# Patient Record
Sex: Female | Born: 1985 | Race: White | Hispanic: No | Marital: Married | Smoking: Never smoker
Health system: Southern US, Community
[De-identification: ages and names within clinical notes are randomized; demographics above are authoritative.]

## PROBLEM LIST (undated history)

## (undated) DIAGNOSIS — O139 Gestational [pregnancy-induced] hypertension without significant proteinuria, unspecified trimester: Secondary | ICD-10-CM

## (undated) DIAGNOSIS — R519 Headache, unspecified: Secondary | ICD-10-CM

## (undated) DIAGNOSIS — O26899 Other specified pregnancy related conditions, unspecified trimester: Secondary | ICD-10-CM

## (undated) DIAGNOSIS — F410 Panic disorder [episodic paroxysmal anxiety] without agoraphobia: Secondary | ICD-10-CM

## (undated) DIAGNOSIS — O09299 Supervision of pregnancy with other poor reproductive or obstetric history, unspecified trimester: Secondary | ICD-10-CM

## (undated) DIAGNOSIS — S42302A Unspecified fracture of shaft of humerus, left arm, initial encounter for closed fracture: Secondary | ICD-10-CM

## (undated) DIAGNOSIS — G5600 Carpal tunnel syndrome, unspecified upper limb: Secondary | ICD-10-CM

## (undated) DIAGNOSIS — R51 Headache: Secondary | ICD-10-CM

## (undated) DIAGNOSIS — S82899A Other fracture of unspecified lower leg, initial encounter for closed fracture: Secondary | ICD-10-CM

## (undated) HISTORY — PX: TONSILLECTOMY: SUR1361

## (undated) HISTORY — PX: CHOLECYSTECTOMY: SHX55

## (undated) HISTORY — PX: APPENDECTOMY: SHX54

---

## 2004-10-06 HISTORY — PX: OTHER SURGICAL HISTORY: SHX169

## 2011-07-03 ENCOUNTER — Encounter: Payer: Self-pay | Admitting: *Deleted

## 2011-07-03 ENCOUNTER — Emergency Department (HOSPITAL_COMMUNITY)
Admission: EM | Admit: 2011-07-03 | Discharge: 2011-07-03 | Disposition: A | Discharge status: Home / Self Care | Payer: BC Managed Care – PPO | Attending: Emergency Medicine | Admitting: Emergency Medicine

## 2011-07-03 ENCOUNTER — Other Ambulatory Visit: Payer: Self-pay

## 2011-07-03 DIAGNOSIS — R079 Chest pain, unspecified: Secondary | ICD-10-CM | POA: Insufficient documentation

## 2011-07-03 DIAGNOSIS — R209 Unspecified disturbances of skin sensation: Secondary | ICD-10-CM | POA: Insufficient documentation

## 2011-07-03 DIAGNOSIS — Z79899 Other long term (current) drug therapy: Secondary | ICD-10-CM | POA: Insufficient documentation

## 2011-07-03 DIAGNOSIS — F411 Generalized anxiety disorder: Secondary | ICD-10-CM | POA: Insufficient documentation

## 2011-07-03 DIAGNOSIS — F419 Anxiety disorder, unspecified: Secondary | ICD-10-CM

## 2011-07-03 DIAGNOSIS — E669 Obesity, unspecified: Secondary | ICD-10-CM | POA: Insufficient documentation

## 2011-07-03 HISTORY — DX: Panic disorder (episodic paroxysmal anxiety): F41.0

## 2011-07-03 MED ORDER — LORAZEPAM 0.5 MG PO TABS: 0.5000 mg | ORAL_TABLET | Freq: Three times a day (TID) | ORAL | Status: AC | PRN

## 2011-07-03 MED ORDER — LORAZEPAM 1 MG PO TABS
1.0000 mg | ORAL_TABLET | Freq: Once | ORAL | Status: AC
  Administered 2011-07-03: 1 mg via ORAL
  Filled 2011-07-03: qty 1

## 2011-07-03 NOTE — ED Provider Notes (Signed)
History   Chart scribed for [name]. Sandra Samarin, MD by Enos Fling; the patient was seen in room APA10/APA10; this patient's care was started at 7:45 PM.    CSN: [id number] Arrival date & time: 07/03/2011  6:41 PM  Chief Complaint  Patient presents with  . Chest Pain  . Numbness    bilateral arm pain   HPI Francenia Newton is a 25 Newtono. female who presents to the Emergency Department complaining of palpitations. Pt states she has been sleep walking for the past 6-7 months. This is new for her and onset while under a lot of stress d/t family custody issues. Pt states another episode occurred last night where she sat up in bed, when her husband woke her up she felt her "heart was jumping out of her chest" and c/o tingling to arms bilaterally. Pt was still c/o palpitations and BUE tingling this evening and currently states she feels she cannot calm down. No other complaints. Denies any chest pain now or previously. Denies sob, n/v, diaphoresis, abd pain, back pain, headache, cough, or congestion. No recent sick contacts or travel. Pt admits to h/o panic attacks, but has been off all meds for the past 4 years. Pt reports fh/o MI in grandfather (age 20) and uncle (age 64).   Past Medical History  Diagnosis Date  . Panic attacks history of    Past Surgical History  Procedure Date  . Appendectomy   . Cholecystectomy   . Tonsillectomy     History reviewed. No pertinent family history.  History  Substance Use Topics  . Smoking status: Never Smoker   . Smokeless tobacco: Not on file  . Alcohol Use: No    OB History    Grav Para Term Preterm Abortions TAB SAB Ect Mult Living                  Review of Systems 10 Systems reviewed and are negative for acute change except as noted in the HPI.  Allergies  Review of patient's allergies indicates no known allergies.  Home Medications   Current Outpatient Rx  Name Route Sig Dispense Refill  . NORGESTIM-ETH ESTRAD TRIPHASIC  0.18/0.215/0.25 MG-25 MCG PO TABS Oral Take 1 tablet by mouth daily.      Marland Kitchen NORGESTIM-ETH ESTRAD TRIPHASIC 0.18/0.215/0.25 MG-35 MCG PO TABS Oral Take 1 tablet by mouth every evening.        BP 127/85  Pulse 89  Temp(Src) 99.2 F (37.3 C) (Oral)  Resp 20  Ht 5\' 6"  (1.676 m)  Wt 225 lb (102.059 kg)  BMI 36.32 kg/m2  SpO2 98%  LMP 07/01/2011  Physical Exam  Nursing note and vitals reviewed. Constitutional: She is oriented to person, place, and time. She appears well-developed and well-nourished. No distress.  HENT:  Head: Normocephalic and atraumatic.  Right Ear: External ear normal.  Left Ear: External ear normal.  Nose: Nose normal.  Mouth/Throat: Oropharynx is clear and moist.  Eyes:       Normal appearance.  Neck: Neck supple.  Cardiovascular: Normal rate, regular rhythm, normal heart sounds and intact distal pulses.   Pulmonary/Chest: Effort normal and breath sounds normal.  Abdominal: Soft. There is no tenderness.  Musculoskeletal: Normal range of motion.       Normal pulses  Neurological: She is alert and oriented to person, place, and time. No sensory deficit.       Motor intact in all extremities  Skin: Skin is warm and dry.  Color normal  Psychiatric: She has a normal mood and affect.    ED Course  Procedures - none  OTHER DATA REVIEWED: Nursing notes and vital signs reviewed.   MEDS GIVEN IN ED: LORazepam (ATIVAN) tablet 1 mg (1 mg Oral Given 07/03/11 2045)     MDM   Pt with symptoms and history consistent with panic attack and some residual anxiety symptoms.  ECG shows NSR at rate 79, normal axis and intervals.  Poor r wave progression, probably due to her chest body habitus, some obesity.  I doubt ACS.  Will treat with oral ativan here.  Pt used tobe on meds for anxiety/panic in the past but had stopped 4 years ago.  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  Pt is counseled to start seeing a PCP to discuss treatment of stress and anxiety.    IMPRESSION: No diagnosis found.  DISCHARGE MEDICATIONS: New Prescriptions   No medications on file    SCRIBE ATTESTATION: I personally performed the services described in this documentation, which was scribed in my presence. The recorded information has been reviewed and considered. Sandra Orea Y.  9:40 PM Pt's symptoms are much improved, relaxed, heart is not poudning, no SOB.    [name]. Oletta Lamas, MD 07/03/11 2141

## 2011-07-03 NOTE — ED Notes (Signed)
Pt 's spouse states pt had an episode during the night where she was sitting up in the bed and was having a nightmare and when pt woke up she was c/o chest pain and tingling to bilateral arms; pt states she has continued to have chest pain and tingling to bilateral arms; pt states she feels hot and can not catch a good breath

## 2011-07-03 NOTE — Discharge Instructions (Signed)
 Anxiety and Panic Attacks Your caregiver has informed you that you are having an anxiety or panic attack. There may be many forms of this. Most of the time these attacks come suddenly and without warning. They come at any time of day, including periods of sleep, and at any time of life. They may be strong and unexplained. Although panic attacks are very scary, they are physically harmless. Sometimes the cause of your anxiety is not known. Anxiety is a protective mechanism of the body in its fight or flight mechanism. Most of these perceived danger situations are actually nonphysical situations (such as anxiety over losing a job). CAUSES The causes of an anxiety or panic attack are many. Panic attacks may occur in otherwise healthy people given a certain set of circumstances. There may be a genetic cause for panic attacks. Some medications may also have anxiety as a side effect. SYMPTOMS Some of the most common feelings are:  Intense terror.  Dizziness, feeling faint.   Hot and cold flashes.   Fear of going crazy.   Feelings that nothing is real.   Sweating.   Shaking.   Chest pain or a fast heartbeat (palpitations).  Smothering, choking sensations.   Feelings of impending doom and that death is near.   Tingling of extremities, this may be from over breathing.   Altered reality (derealization).   Being detached from yourself (depersonalization).   Several symptoms can be present to make up anxiety or panic attacks. DIAGNOSIS The evaluation by your caregiver will depend on the type of symptoms you are experiencing. The diagnosis of anxiety or pain attack is made when no physical illness can be determined to be a cause of the symptoms. TREATMENT Treatment to prevent anxiety and panic attacks may include:  Avoidance of circumstances that cause anxiety.   Reassurance and relaxation.   Regular exercise.   Relaxation therapies, such as yoga.   Psychotherapy with a psychiatrist  or therapist.   Avoidance of caffeine, alcohol and illegal drugs.   Prescribed medication.  SEEK IMMEDIATE MEDICAL CARE IF:  You experience panic attack symptoms that are different than your usual symptoms.   You have any worsening or concerning symptoms.  Document Released: 09/22/2005 Document Re-Released: 03/12/2010 [facility] Patient Information 2011 [place], [place].    Ativan  can cause drowsiness or slowed breathing so use with caution, no driving or operating machinery.   Do not combine with other sedatives, or alcohol

## 2011-12-11 ENCOUNTER — Encounter (HOSPITAL_COMMUNITY): Payer: Self-pay | Admitting: *Deleted

## 2011-12-11 ENCOUNTER — Emergency Department (INDEPENDENT_AMBULATORY_CARE_PROVIDER_SITE_OTHER)
Admission: EM | Admit: 2011-12-11 | Discharge: 2011-12-11 | Disposition: A | Discharge status: Home / Self Care | Payer: BC Managed Care – PPO | Source: Home / Self Care

## 2011-12-11 ENCOUNTER — Emergency Department (INDEPENDENT_AMBULATORY_CARE_PROVIDER_SITE_OTHER): Payer: BC Managed Care – PPO

## 2011-12-11 DIAGNOSIS — S161XXA Strain of muscle, fascia and tendon at neck level, initial encounter: Secondary | ICD-10-CM

## 2011-12-11 DIAGNOSIS — S139XXA Sprain of joints and ligaments of unspecified parts of neck, initial encounter: Secondary | ICD-10-CM

## 2011-12-11 MED ORDER — IBUPROFEN 800 MG PO TABS: 800.0000 mg | ORAL_TABLET | Freq: Three times a day (TID) | ORAL | Status: AC

## 2011-12-11 MED ORDER — TRAMADOL HCL 50 MG PO TABS: 50.0000 mg | ORAL_TABLET | Freq: Four times a day (QID) | ORAL | Status: AC | PRN

## 2011-12-11 MED ORDER — METHOCARBAMOL 750 MG PO TABS: 750.0000 mg | ORAL_TABLET | Freq: Four times a day (QID) | ORAL | Status: AC
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
## 2011-12-11 NOTE — Discharge Instructions (Signed)
Your cervical spine x-ray was normal. You may have more muscle soreness and stiffness tomorrow. Your symptoms should gradually improve over the next few days. If her symptoms are not improving in one week call Dr. Luiz Blare office for a followup appointment

## 2011-12-12 NOTE — ED Provider Notes (Signed)
Medical screening examination/treatment/procedure(s) were performed by non-physician practitioner and as supervising physician I was immediately available for consultation/collaboration.  Luiz Blare MD   Domenick Gong, MD 12/12/11 [phone]

## 2012-02-08 ENCOUNTER — Ambulatory Visit (INDEPENDENT_AMBULATORY_CARE_PROVIDER_SITE_OTHER): Payer: BC Managed Care – PPO | Admitting: Internal Medicine

## 2012-02-08 ENCOUNTER — Encounter: Payer: Self-pay | Admitting: Internal Medicine

## 2012-02-08 VITALS — BP 134/83 | HR 80 | Temp 98.7°F | Resp 20 | Ht 65.5 in | Wt 233.0 lb

## 2012-02-08 DIAGNOSIS — L049 Acute lymphadenitis, unspecified: Secondary | ICD-10-CM

## 2012-02-08 NOTE — Progress Notes (Signed)
  Subjective:    Patient ID: [name], female    DOB: [birth year], 26 y.o.   MRN: [id number]  HPIComplaining of a sore knot on the right posterior scalp for 3 days She has had bad allergy symptoms for the last 2 weeks but these seem to be responding to Zyrtec Just prior to the onset of the knot she had a pustule in the scalp which was tender for 3 days but resolved    Review of Systems     Objective:   Physical Exam Vital signs stable except overweight Right posterior cervical lymph node 1 cm tender without cellulitis or redness There no other posterior cervical nodes No scalp lesions are found Tympanic membranes and canals are normal Nares are boggy with clear mucus       Assessment & Plan:  Problem #1 lymphadenitis secondary to prior scalp lesion  Doxycycline 100 twice a day #10 Recheck if not well in one week

## 2012-09-28 ENCOUNTER — Encounter (HOSPITAL_COMMUNITY): Payer: Self-pay

## 2012-09-28 ENCOUNTER — Emergency Department (HOSPITAL_COMMUNITY)
Admission: EM | Admit: 2012-09-28 | Discharge: 2012-09-28 | Disposition: A | Discharge status: Home / Self Care | Payer: BC Managed Care – PPO | Attending: Emergency Medicine | Admitting: Emergency Medicine

## 2012-09-28 ENCOUNTER — Other Ambulatory Visit: Payer: Self-pay

## 2012-09-28 DIAGNOSIS — K219 Gastro-esophageal reflux disease without esophagitis: Secondary | ICD-10-CM | POA: Insufficient documentation

## 2012-09-28 DIAGNOSIS — R1013 Epigastric pain: Secondary | ICD-10-CM | POA: Insufficient documentation

## 2012-09-28 DIAGNOSIS — Z9889 Other specified postprocedural states: Secondary | ICD-10-CM | POA: Insufficient documentation

## 2012-09-28 DIAGNOSIS — Z79899 Other long term (current) drug therapy: Secondary | ICD-10-CM | POA: Insufficient documentation

## 2012-09-28 DIAGNOSIS — Z9089 Acquired absence of other organs: Secondary | ICD-10-CM | POA: Insufficient documentation

## 2012-09-28 DIAGNOSIS — Z8659 Personal history of other mental and behavioral disorders: Secondary | ICD-10-CM | POA: Insufficient documentation

## 2012-09-28 MED ORDER — PANTOPRAZOLE SODIUM 40 MG PO TBEC
40.0000 mg | DELAYED_RELEASE_TABLET | Freq: Once | ORAL | Status: AC
  Administered 2012-09-28: 40 mg via ORAL

## 2012-09-28 MED ORDER — ESOMEPRAZOLE MAGNESIUM 40 MG PO CPDR: 40.0000 mg | DELAYED_RELEASE_CAPSULE | Freq: Every day | ORAL | Status: DC

## 2012-09-28 MED ORDER — GI COCKTAIL ~~LOC~~
30.0000 mL | Freq: Once | ORAL | Status: AC
  Administered 2012-09-28: 30 mL via ORAL
  Filled 2012-09-28: qty 30

## 2012-09-28 NOTE — ED Notes (Signed)
Discharge instructions reviewed with pt, questions answered. Pt verbalized understanding.  

## 2012-09-28 NOTE — ED Notes (Signed)
Pt c/o burning pain in center of chest onset last night approx 11 pm, states feels like heartburn and took zantac for same with minimal relief.

## 2012-09-28 NOTE — ED Provider Notes (Signed)
History     CSN: [id number]  Arrival date & time 09/28/12  7829   First MD Initiated Contact with Patient 09/28/12 [phone]      Chief Complaint  Patient presents with  . Chest Pain    (Consider location/radiation/quality/duration/timing/severity/associated sxs/prior treatment) HPI This is a 26 year old female with a history of gastroesophageal reflux disease. She developed mild to moderate substernal burning yesterday evening about 10:30 PM. She took Zantac and went to bed. She woke this morning with more severe burning in her chest. It is similar to episodes she has had the past. There was no associated shortness of breath or nausea but she did have a bloated feeling in her epigastrium. She was given a GI cocktail prior to my evaluation with significant relief of her symptoms. She has been treated with Nexium in the past but is not currently on a proton pump inhibitor.  Past Medical History  Diagnosis Date  . Panic attacks history of    Past Surgical History  Procedure Date  . Appendectomy   . Cholecystectomy   . Tonsillectomy     No family history on file.  History  Substance Use Topics  . Smoking status: Never Smoker   . Smokeless tobacco: Not on file  . Alcohol Use: No    OB History    Grav Para Term Preterm Abortions TAB SAB Ect Mult Living                  Review of Systems  All other systems reviewed and are negative.    Allergies  Review of patient's allergies indicates no known allergies.  Home Medications   Current Outpatient Rx  Name  Route  Sig  Dispense  Refill  . CETIRIZINE HCL 10 MG PO TABS   Oral   Take 10 mg by mouth daily.         Marland Kitchen PRENATAL MULTIVITAMIN CH   Oral   Take 1 tablet by mouth daily.         Marland Kitchen NORGESTIM-ETH ESTRAD TRIPHASIC 0.18/0.215/0.25 MG-35 MCG PO TABS   Oral   Take 1 tablet by mouth every evening.             BP 118/61  Pulse 93  Temp 98.5 F (36.9 C) (Oral)  Resp 18  Ht 5\' 6"  (1.676 m)  Wt 215 lb  (97.523 kg)  BMI 34.70 kg/m2  SpO2 100%  LMP 09/18/2012  Physical Exam General: Well-developed, well-nourished female in no acute distress; appearance consistent with age of record HENT: normocephalic, atraumatic Eyes: pupils equal round and reactive to light; extraocular muscles intact Neck: supple Heart: regular rate and rhythm; no murmurs, rubs or gallops Lungs: clear to auscultation bilaterally Abdomen: soft; nondistended; nontender; bowel sounds present Extremities: No deformity; full range of motion Neurologic: Awake, alert and oriented; motor function intact in all extremities and symmetric; no facial droop Skin: Warm and dry Psychiatric: Normal mood and affect    ED Course  Procedures (including critical care time)    MDM     Date: 09/28/2012 5:06 AM  Rate: 89  Rhythm: normal sinus rhythm  QRS Axis: normal  Intervals: normal  ST/T Wave abnormalities: normal  Conduction Disutrbances: none  Narrative Interpretation: unremarkable  Comparison with previous EKG: unchanged           Carlisle Beers Devondre Guzzetta, MD 09/28/12 0600

## 2012-10-06 DIAGNOSIS — O139 Gestational [pregnancy-induced] hypertension without significant proteinuria, unspecified trimester: Secondary | ICD-10-CM

## 2012-10-06 HISTORY — DX: Gestational (pregnancy-induced) hypertension without significant proteinuria, unspecified trimester: O13.9

## 2013-01-18 LAB — OB RESULTS CONSOLE RPR: RPR: NONREACTIVE

## 2013-01-18 LAB — OB RESULTS CONSOLE ABO/RH

## 2013-02-01 LAB — OB RESULTS CONSOLE GC/CHLAMYDIA
Chlamydia: NEGATIVE
Gonorrhea: NEGATIVE

## 2013-08-09 LAB — OB RESULTS CONSOLE GBS: GBS: NEGATIVE

## 2013-08-14 ENCOUNTER — Inpatient Hospital Stay (HOSPITAL_COMMUNITY)
Admission: AD | Admit: 2013-08-14 | Discharge: 2013-08-15 | Disposition: A | Discharge status: Home / Self Care | Payer: BC Managed Care – PPO | Source: Ambulatory Visit | Attending: Obstetrics and Gynecology | Admitting: Obstetrics and Gynecology

## 2013-08-14 ENCOUNTER — Encounter (HOSPITAL_COMMUNITY): Payer: Self-pay

## 2013-08-14 ENCOUNTER — Inpatient Hospital Stay (HOSPITAL_COMMUNITY): Payer: BC Managed Care – PPO

## 2013-08-14 DIAGNOSIS — O479 False labor, unspecified: Secondary | ICD-10-CM

## 2013-08-14 DIAGNOSIS — O368190 Decreased fetal movements, unspecified trimester, not applicable or unspecified: Secondary | ICD-10-CM | POA: Insufficient documentation

## 2013-08-14 DIAGNOSIS — R109 Unspecified abdominal pain: Secondary | ICD-10-CM

## 2013-08-14 DIAGNOSIS — O99891 Other specified diseases and conditions complicating pregnancy: Secondary | ICD-10-CM | POA: Insufficient documentation

## 2013-08-14 DIAGNOSIS — M549 Dorsalgia, unspecified: Secondary | ICD-10-CM | POA: Insufficient documentation

## 2013-08-14 DIAGNOSIS — R1031 Right lower quadrant pain: Secondary | ICD-10-CM | POA: Insufficient documentation

## 2013-08-14 LAB — URINALYSIS, ROUTINE W REFLEX MICROSCOPIC
Ketones, ur: NEGATIVE mg/dL
Leukocytes, UA: NEGATIVE
Nitrite: NEGATIVE
Protein, ur: NEGATIVE mg/dL

## 2013-08-14 NOTE — MAU Note (Signed)
Constant right sided abdominal pain, low back pain, & abdominal tightness since this morning. Denies vaginal bleeding. No fetal movement since this morning.

## 2013-08-15 LAB — COMPREHENSIVE METABOLIC PANEL
AST: 16 U/L (ref 0–37)
Albumin: 2.8 g/dL — ABNORMAL LOW (ref 3.5–5.2)
CO2: 22 mEq/L (ref 19–32)
Calcium: 9.3 mg/dL (ref 8.4–10.5)
Creatinine, Ser: 0.6 mg/dL (ref 0.50–1.10)
GFR calc non Af Amer: >90 mL/min (ref >90)

## 2013-08-15 LAB — CBC
Hemoglobin: 12.5 g/dL (ref 12.0–15.0)
MCH: 30.6 pg (ref 26.0–34.0)
RBC: 4.08 MIL/uL (ref 3.87–5.11)
WBC: 14 10*3/uL — ABNORMAL HIGH (ref 4.0–10.5)

## 2013-08-15 NOTE — MAU Provider Note (Signed)
History     CSN: [id number]  Arrival date and time: 08/14/13 2256 Provider notified of patient: 0110 Provider at bedside: 0135   First Provider Initiated Contact with Patient 08/15/13 0139      Chief Complaint  Patient presents with  . Abdominal Pain  . Back Pain  . Decreased Fetal Movement   HPI  [name] is a 27 y.o G1P0 female at 2.[redacted] wks gestation presenting with complaints of constant  RLQ pain all evening.  She reports maintaining hydration and resting.  She reports feeling FM, but decreased. She denies VB or LOF.    Past Medical History  Diagnosis Date  . Panic attacks history of    Past Surgical History  Procedure Laterality Date  . Appendectomy    . Cholecystectomy    . Tonsillectomy      History reviewed. No pertinent family history.  History  Substance Use Topics  . Smoking status: Never Smoker   . Smokeless tobacco: Not on file  . Alcohol Use: No    Allergies: No Known Allergies  Prescriptions prior to admission  Medication Sig Dispense Refill  . Prenatal Vit-Fe Fumarate-FA (PRENATAL MULTIVITAMIN) TABS Take 1 tablet by mouth daily.      . cetirizine (ZYRTEC) 10 MG tablet Take 10 mg by mouth daily.        Review of Systems  Constitutional: Negative.   HENT: Negative.   Eyes: Negative.   Respiratory: Negative.   Cardiovascular: Negative.   Gastrointestinal: Positive for abdominal pain.       RLQ pain since yesterday Decreased FM, last felt @ 0900 yesterday  Genitourinary: Negative.   Musculoskeletal: Negative.   Skin: Negative.   Neurological: Negative.   Endo/Heme/Allergies: Negative.   Psychiatric/Behavioral: Negative.    Results for orders placed during the hospital encounter of 08/14/13 (from the past 24 hour(s))  URINALYSIS, ROUTINE W REFLEX MICROSCOPIC     Status: Abnormal   Collection Time    08/14/13 11:00 PM      Result Value Range   Color, Urine YELLOW  YELLOW   APPearance CLEAR  CLEAR   Specific Gravity,  Urine <1.005 (*) 1.005 - 1.030   pH 5.5  5.0 - 8.0   Glucose, UA NEGATIVE  NEGATIVE mg/dL   Hgb urine dipstick NEGATIVE  NEGATIVE   Bilirubin Urine NEGATIVE  NEGATIVE   Ketones, ur NEGATIVE  NEGATIVE mg/dL   Protein, ur NEGATIVE  NEGATIVE mg/dL   Urobilinogen, UA 0.2  0.0 - 1.0 mg/dL   Nitrite NEGATIVE  NEGATIVE   Leukocytes, UA NEGATIVE  NEGATIVE  COMPREHENSIVE METABOLIC PANEL     Status: Abnormal   Collection Time    08/15/13  2:13 AM      Result Value Range   Sodium 133 (*) 135 - 145 mEq/L   Potassium 3.9  3.5 - 5.1 mEq/L   Chloride 100  96 - 112 mEq/L   CO2 22  19 - 32 mEq/L   Glucose, Bld 100 (*) 70 - 99 mg/dL   BUN 7  6 - 23 mg/dL   Creatinine, Ser 6.44  0.50 - 1.10 mg/dL   Calcium 9.3  8.4 - 03.4 mg/dL   Total Protein 6.4  6.0 - 8.3 g/dL   Albumin 2.8 (*) 3.5 - 5.2 g/dL   AST 16  0 - 37 U/L   ALT 18  0 - 35 U/L   Alkaline Phosphatase 139 (*) 39 - 117 U/L   Total Bilirubin 0.2 (*) 0.3 -  1.2 mg/dL   GFR calc non Af Amer >90  >90 mL/min   GFR calc Af Amer >90  >90 mL/min  CBC     Status: Abnormal   Collection Time    08/15/13  2:13 AM      Result Value Range   WBC 14.0 (*) 4.0 - 10.5 K/uL   RBC 4.08  3.87 - 5.11 MIL/uL   Hemoglobin 12.5  12.0 - 15.0 g/dL   HCT 16.1  09.6 - 04.5 %   MCV 88.2  78.0 - 100.0 fL   MCH 30.6  26.0 - 34.0 pg   MCHC 34.7  30.0 - 36.0 g/dL   RDW 40.9  81.1 - 91.4 %   Platelets 241  150 - 400 K/uL   BPP: 6/8 in 31 minutes, AFI: 19.33cm  Physical Exam   Pulse 105, temperature 98.7 F (37.1 C), temperature source Oral, resp. rate 18, height 5' 5.5" (1.664 m), weight 117.935 kg (260 lb), last menstrual period 09/18/2012, SpO2 97.00%.  Physical Exam  Constitutional: She appears well-developed and well-nourished.  HENT:  Head: Normocephalic and atraumatic.  Eyes: EOM are normal.  Neck: Normal range of motion.  Cardiovascular: Normal rate, regular rhythm, normal heart sounds and intact distal pulses.   Respiratory: Effort normal and  breath sounds normal.  GI: Soft.  Relaxes appropriately after a contraction  VE: closed/thick/high Exam by: Judeth Horn, RN  MAU Course  Procedures EFM CCUA BPP/AFI CBC CMP Assessment and Plan  G1P0 37.3wks IUP Abdominal Pain of unknown etiology vs. Braxton-Hicks Contractions  Discharge Home Labor Precautions Continue with hydration and rest Keep appointment scheduled for 08/18/2013  *Dr. Billy Coast consulted on plan - agrees with plan  Kenard Gower, MSN, CNM 08/15/2013, 1:47 AM

## 2013-08-26 ENCOUNTER — Inpatient Hospital Stay (HOSPITAL_COMMUNITY)
Admission: AD | Admit: 2013-08-26 | Discharge: 2013-08-30 | DRG: 765 | Disposition: A | Discharge status: Home / Self Care | Payer: BC Managed Care – PPO | Source: Ambulatory Visit | Attending: Obstetrics & Gynecology | Admitting: Obstetrics & Gynecology

## 2013-08-26 ENCOUNTER — Encounter (HOSPITAL_COMMUNITY): Payer: Self-pay | Admitting: *Deleted

## 2013-08-26 DIAGNOSIS — O9903 Anemia complicating the puerperium: Secondary | ICD-10-CM | POA: Diagnosis not present

## 2013-08-26 DIAGNOSIS — O139 Gestational [pregnancy-induced] hypertension without significant proteinuria, unspecified trimester: Principal | ICD-10-CM | POA: Diagnosis present

## 2013-08-26 DIAGNOSIS — O133 Gestational [pregnancy-induced] hypertension without significant proteinuria, third trimester: Secondary | ICD-10-CM

## 2013-08-26 DIAGNOSIS — Z98891 History of uterine scar from previous surgery: Secondary | ICD-10-CM

## 2013-08-26 DIAGNOSIS — D62 Acute posthemorrhagic anemia: Secondary | ICD-10-CM | POA: Diagnosis not present

## 2013-08-26 DIAGNOSIS — O3660X0 Maternal care for excessive fetal growth, unspecified trimester, not applicable or unspecified: Secondary | ICD-10-CM | POA: Diagnosis present

## 2013-08-26 DIAGNOSIS — O324XX0 Maternal care for high head at term, not applicable or unspecified: Secondary | ICD-10-CM | POA: Diagnosis present

## 2013-08-26 HISTORY — DX: Unspecified fracture of shaft of humerus, left arm, initial encounter for closed fracture: S42.302A

## 2013-08-26 HISTORY — DX: Other fracture of unspecified lower leg, initial encounter for closed fracture: S82.899A

## 2013-08-26 LAB — COMPREHENSIVE METABOLIC PANEL
Albumin: 2.8 g/dL — ABNORMAL LOW (ref 3.5–5.2)
Alkaline Phosphatase: 144 U/L — ABNORMAL HIGH (ref 39–117)
BUN: 8 mg/dL (ref 6–23)
CO2: 20 mEq/L (ref 19–32)
Chloride: 102 mEq/L (ref 96–112)
Creatinine, Ser: 0.54 mg/dL (ref 0.50–1.10)
GFR calc Af Amer: >90 mL/min (ref >90)
GFR calc non Af Amer: >90 mL/min (ref >90)
Glucose, Bld: 88 mg/dL (ref 70–99)
Total Bilirubin: 0.2 mg/dL — ABNORMAL LOW (ref 0.3–1.2)

## 2013-08-26 LAB — URIC ACID: Uric Acid, Serum: 5.4 mg/dL (ref 2.4–7.0)

## 2013-08-26 LAB — CBC
HCT: 35.8 % — ABNORMAL LOW (ref 36.0–46.0)
Hemoglobin: 12.7 g/dL (ref 12.0–15.0)
MCV: 86.9 fL (ref 78.0–100.0)
RDW: 12.6 % (ref 11.5–15.5)
WBC: 9.4 10*3/uL (ref 4.0–10.5)

## 2013-08-26 LAB — RPR: RPR Ser Ql: NONREACTIVE

## 2013-08-26 LAB — LACTATE DEHYDROGENASE: LDH: 174 U/L (ref 94–250)

## 2013-08-26 LAB — ABO/RH: ABO/RH(D): A POS

## 2013-08-26 MED ORDER — MISOPROSTOL 25 MCG QUARTER TABLET
25.0000 ug | ORAL_TABLET | ORAL | Status: DC | PRN
  Administered 2013-08-26 – 2013-08-27 (×4): 25 ug via VAGINAL
  Filled 2013-08-26 (×4): qty 0.25

## 2013-08-26 MED ORDER — ACETAMINOPHEN 500 MG PO TABS
1000.0000 mg | ORAL_TABLET | Freq: Once | ORAL | Status: AC
  Administered 2013-08-26: 1000 mg via ORAL
  Filled 2013-08-26: qty 2

## 2013-08-26 MED ORDER — OXYCODONE-ACETAMINOPHEN 5-325 MG PO TABS: 1.0000 | ORAL_TABLET | ORAL | Status: DC | PRN

## 2013-08-26 MED ORDER — OXYTOCIN BOLUS FROM INFUSION: 500.0000 mL | INTRAVENOUS | Status: DC

## 2013-08-26 MED ORDER — LACTATED RINGERS IV SOLN
INTRAVENOUS | Status: DC
  Administered 2013-08-28 (×2): via INTRAVENOUS

## 2013-08-26 MED ORDER — OXYTOCIN 40 UNITS IN LACTATED RINGERS INFUSION - SIMPLE MED: 62.5000 mL/h | INTRAVENOUS | Status: DC

## 2013-08-26 MED ORDER — LIDOCAINE HCL (PF) 1 % IJ SOLN
30.0000 mL | INTRAMUSCULAR | Status: DC | PRN
  Filled 2013-08-26: qty 30

## 2013-08-26 MED ORDER — ACETAMINOPHEN 325 MG PO TABS
650.0000 mg | ORAL_TABLET | ORAL | Status: DC | PRN
  Administered 2013-08-28: 650 mg via ORAL
  Filled 2013-08-26: qty 2

## 2013-08-26 MED ORDER — TERBUTALINE SULFATE 1 MG/ML IJ SOLN: 0.2500 mg | Freq: Once | INTRAMUSCULAR | Status: AC | PRN

## 2013-08-26 MED ORDER — IBUPROFEN 600 MG PO TABS: 600.0000 mg | ORAL_TABLET | Freq: Four times a day (QID) | ORAL | Status: DC | PRN

## 2013-08-26 MED ORDER — ONDANSETRON HCL 4 MG/2ML IJ SOLN: 4.0000 mg | Freq: Four times a day (QID) | INTRAMUSCULAR | Status: DC | PRN

## 2013-08-26 MED ORDER — FLEET ENEMA 7-19 GM/118ML RE ENEM: 1.0000 | ENEMA | RECTAL | Status: DC | PRN

## 2013-08-26 MED ORDER — CITRIC ACID-SODIUM CITRATE 334-500 MG/5ML PO SOLN
30.0000 mL | ORAL | Status: DC | PRN
  Administered 2013-08-28: 30 mL via ORAL
  Filled 2013-08-26: qty 15

## 2013-08-26 MED ORDER — LACTATED RINGERS IV SOLN: 500.0000 mL | INTRAVENOUS | Status: DC | PRN

## 2013-08-26 NOTE — H&P (Deleted)
[name] is a 27 y.o. female G1, presenting for labor IOL at 39 wks for Gestational HTN. She was scheduled for IOL obn 11/23 night but was moved to today due to HA/nausea/vomiting and visual spots.  [facility], Primary Dr Seymour Bars. Obese but no medical hx. Developed GHTN, but well controlled without medication. No GDM. Macrosomia suspected, last office sono at 37 wks 8'12" at 97% and AC at 99% and AFI 25 cm. ANtesting wkly for GHTN and polyhydramnios, reactive NST and BPP 8/8.   Maternal Medical History:  Reason for admission: Nausea.     OB History   Grav Para Term Preterm Abortions TAB SAB Ect Mult Living   1              Past Medical History  Diagnosis Date  . Panic attacks history of  . Arm fracture, left   . Ankle fracture     hx of rt   Past Surgical History  Procedure Laterality Date  . Appendectomy    . Cholecystectomy    . Tonsillectomy    . Tendonitis surgery  2006   Family History: family history includes Cancer in her maternal grandfather and maternal uncle. Social History:  reports that she has never smoked. She has never used smokeless tobacco. She reports that she does not drink alcohol or use illicit drugs.   Prenatal Transfer Tool  Maternal Diabetes: No Genetic Screening: Normal Ultrascreen and AFP1 normal Maternal Ultrasounds/Referrals: Normal Fetal Ultrasounds or other Referrals:  None Maternal Substance Abuse:  No Significant Maternal Medications:  None Significant Maternal Lab Results:  Lab values include: Group B Strep negative. Other Comments:  Polyhydramnios and suspected Macrosomia Gestational HTN, no meds, no preeclampsia  Review of Systems  Eyes: Positive for blurred vision.  Respiratory: Negative for shortness of breath.   Cardiovascular: Negative for chest pain.  Gastrointestinal: Positive for nausea and vomiting. Negative for abdominal pain.  Neurological: Positive for headaches.    Dilation: Fingertip Effacement (%):  Thick Station: Ballotable Exam by:: Currie Paris, RN Blood pressure 118/72, pulse 87, temperature 98.2 F (36.8 C), temperature source Oral, resp. rate 18, height 5\' 6"  (1.676 m), weight 258 lb (117.028 kg), last menstrual period 09/18/2012. Exam Physical Exam  Physical exam:  A&O x 3, no acute distress. Pleasant HEENT neg Lungs CTA bilat CV RRR, S1S2 normal Abdo soft, non tender, non acute. Gravid uterus S>D. Vertex by bedside sono Extr no edema/ tenderness. DTR +2/ +2 Pelvic long closed cx, station high, ballotable FHT  140s/ + accels/ no decels/oderate variability- category I Toco occasional   Prenatal labs: ABO, Rh: --/--/A POS (11/21 2130) Antibody: NEG (11/21 0955) Rubella:  Immune RPR:   neg HBsAg:   neg HIV:   neg GBS: Negative (11/04 0000)  Glucola normal  Assessment/Plan: 27 yo, G1 at 39 wks, GHTN with worsening BPs and symptoms of PEC, admitted for IOL. BPs better since arrived to MAU. PEC labs are normal. Urine P/C ratio- normal at 0.13.  Plan Cytotec IOL.  LGA/ macrosomia suspected, pt counseled on increased shoulder dystocia and c-section risk, understands and agrees.     Hermon Zea R 08/26/2013, 1:37 PM

## 2013-08-26 NOTE — Progress Notes (Signed)
[name] is a 27 y.o. G1P0 at [redacted]w[redacted]d by LMP admitted for induction of labor due to Hypertension.  Subjective: No headache or visual changes.  BP improved on bedrest since admission  Objective: BP 106/62  Pulse 70  Temp(Src) 98.1 F (36.7 C) (Oral)  Resp 18  Ht 5\' 6"  (1.676 m)  Wt 117.028 kg (258 lb)  BMI 41.66 kg/m2  SpO2 96%  LMP 09/18/2012      FHT:  FHR: 155 bpm, variability: moderate,  accelerations:  Present,  decelerations:  Absent UC:   irregular, every 10 minutes SVE:   Dilation: Fingertip Effacement (%): Thick Station: Ballotable Exam by:: Currie Paris, RN  Labs: Lab Results  Component Value Date   WBC 9.4 08/26/2013   HGB 12.7 08/26/2013   HCT 35.8* 08/26/2013   MCV 86.9 08/26/2013   PLT 245 08/26/2013    Assessment / Plan: Gestational HTN- no s/s PEC Presumed LGA   Labor: Progressing normally Preeclampsia:  no signs or symptoms of toxicity, intake and ouput balanced and labs stable Fetal Wellbeing:  Category I Pain Control:  Labor support without medications I/D:  n/a Anticipated MOD:  VD vs Csec  [name] J 08/26/2013, 9:22 PM

## 2013-08-26 NOTE — MAU Note (Signed)
PIH induction, bed not available on L&D, will start here.  Dr Juliene Pina called in with orders

## 2013-08-26 NOTE — MAU Note (Signed)
Pt nauseated, repositioned, ? Vagal response after IV started, BP was low. Baby very active

## 2013-08-26 NOTE — MAU Note (Signed)
Pt feeling better, nausea improved. No longer feeling sweaty.

## 2013-08-27 MED ORDER — MISOPROSTOL 25 MCG QUARTER TABLET
25.0000 ug | ORAL_TABLET | ORAL | Status: DC | PRN
  Administered 2013-08-27 (×2): 25 ug via VAGINAL
  Filled 2013-08-27 (×2): qty 0.25

## 2013-08-27 NOTE — Progress Notes (Signed)
[name] is a 27 y.o. G1P0 at [redacted]w[redacted]d by LMP admitted for induction of labor due to Hypertension.  Subjective: Good FM. No HA or CP or SOB Contractions irregular  Objective: BP 128/80  Pulse 79  Temp(Src) 98.1 F (36.7 C) (Oral)  Resp 18  Ht 5\' 6"  (1.676 m)  Wt 117.028 kg (258 lb)  BMI 41.66 kg/m2  SpO2 96%  LMP 09/18/2012 Temp:  [98 F (36.7 C)-98.2 F (36.8 C)] 98.1 F (36.7 C) (11/22 0949) Pulse Rate:  [68-95] 79 (11/22 1114) Resp:  [18-20] 18 (11/22 1114) BP: (99-131)/(58-88) 128/80 mmHg (11/22 1114) SpO2:  [96 %] 96 % (11/21 1512) Weight:  [117.028 kg (258 lb)] 117.028 kg (258 lb) (11/21 1215) Lgs: CTA CV: RRR ABD: gravid , NT, No RUQ tenderness EXT: DTRs 2+ , no clonus    FHT:  FHR: 155 bpm, variability: moderate,  accelerations:  Present,  decelerations:  Absent UC:   irregular, every 5 minutes SVE:   Dilation: 1 Effacement (%): 50 Station: -3 Exam by:: Cleone Slim RN  Cytotec placed  Labs: Lab Results  Component Value Date   WBC 9.4 08/26/2013   HGB 12.7 08/26/2013   HCT 35.8* 08/26/2013   MCV 86.9 08/26/2013   PLT 245 08/26/2013    Assessment / Plan: Gestational HTN No s/s PEC with nl labs Unfavorable cervix LGA  Labor: Progressing normally Preeclampsia:  no signs or symptoms of toxicity, intake and ouput balanced and labs stable Fetal Wellbeing:  Category I Pain Control:  Labor support without medications I/D:  n/a Anticipated MOD:  VD vs csec Monitor s/s PEC closely.  Medrith Veillon J 08/27/2013, 11:16 AM

## 2013-08-27 NOTE — Progress Notes (Signed)
[name] is a 27 y.o. G1P0 at [redacted]w[redacted]d by LMP admitted for induction of labor due to Hypertension.  Subjective: Feels crampy Good FM No HA or visual sxs or epigastric pain  Objective: BP 125/90  Pulse 71  Temp(Src) 97.8 F (36.6 C) (Oral)  Resp 18  Ht 5\' 6"  (1.676 m)  Wt 117.028 kg (258 lb)  BMI 41.66 kg/m2  SpO2 96%  LMP 09/18/2012      FHT:  FHR: 145 bpm, variability: moderate,  accelerations:  Present,  decelerations:  Absent UC:   irregular, every 5 minutes SVE:   Dilation: 1.5 Effacement (%): 50 Station: Ballotable Exam by:: Jermeka Schlotterbeck Cervical balloon placed without difficulty Minimal bleeding noted.  Labs: Lab Results  Component Value Date   WBC 9.4 08/26/2013   HGB 12.7 08/26/2013   HCT 35.8* 08/26/2013   MCV 86.9 08/26/2013   PLT 245 08/26/2013    Assessment / Plan: Gestational HTN- no s/s PEC Presumed LGA  Labor: Balloon in place Preeclampsia:  no signs or symptoms of toxicity, intake and ouput balanced and labs stable Fetal Wellbeing:  Category I Pain Control:  Epidural I/D:  n/a Anticipated MOD:  CAutioius approach to VD Rpt labs in am  Herta Hink J 08/27/2013, 10:28 PM

## 2013-08-27 NOTE — H&P (Signed)
NAMEJESSIECA, [name]            ACCOUNT NO.:  [id number]  MEDICAL RECORD NO.:  [id number]  LOCATION:  9170                          FACILITY:  [facility]  PHYSICIAN:  Lenoard Aden, M.D.DATE OF BIRTH:  1986-02-18  DATE OF ADMISSION:  08/26/2013 DATE OF DISCHARGE:                             HISTORY & PHYSICAL   CHIEF COMPLAINT:  Gestational hypertension at term, sent from the office for induction due to elevated BP with N/V.  HISTORY OF PRESENT ILLNESS:  She is a 27 year old white female, G1, P0, at 80 weeks' gestation for induction for gestational hypertension.  The patient is seen in the office today with elevated blood pressure and was sent for further evaluation and questionable induction, although unfavorable cervix noted.  ALLERGIES:  She has no known drug allergies.  MEDICATIONS:  Prenatal vitamins.  The medications also include Fioricet, Zyrtec as needed, Tums.  SOCIAL HISTORY:  She is a nonsmoker, nondrinker.  She denies domestic or physical violence.  PAST MEDICAL HISTORY:  She has a medical history which is noncontributory.  FAMILY HISTORY:  Noncontributory.  PAST SURGICAL HISTORY:  Tonsillectomy, appendectomy, and cholecystectomy.  PHYSICAL EXAMINATION:  GENERAL:  Well-developed, well-nourished obese white female, in no acute distress. HEENT:  Normal. NECK:  Supple.  Full range of motion. LUNGS:  Clear. HEART:  Regular rhythm. ABDOMEN:  Soft, gravid, nontender.  Estimated fetal weight 9-1/2 pounds. Cervix is fingertip, 50%, vertex, ballotable. EXTREMITIES:  There are no cords. NEUROLOGIC:  Nonfocal. SKIN:  Intact.  NST is reactive.  Cytotec placed.  LABORATORY DATA:  CBC and CMP within normal limits.  IMPRESSION:  Gestational hypertension at term.  PLAN:  Proceed with induction.  Anticipate cautious attempts towards vaginal delivery, likely C-section.  Monitor signs and symptoms of preeclampsia closely.     Lenoard Aden,  M.D.     RJT/MEDQ  D:  08/26/2013  T:  08/27/2013  Job:  [id number]

## 2013-08-27 NOTE — Progress Notes (Signed)
cytotec placed. 

## 2013-08-28 ENCOUNTER — Inpatient Hospital Stay (HOSPITAL_COMMUNITY): Payer: BC Managed Care – PPO | Admitting: Anesthesiology

## 2013-08-28 ENCOUNTER — Encounter (HOSPITAL_COMMUNITY): Payer: Self-pay | Admitting: *Deleted

## 2013-08-28 ENCOUNTER — Encounter (HOSPITAL_COMMUNITY): Payer: BC Managed Care – PPO | Admitting: Anesthesiology

## 2013-08-28 ENCOUNTER — Encounter (HOSPITAL_COMMUNITY)
Admission: AD | Disposition: A | Discharge status: Home / Self Care | Payer: Self-pay | Source: Ambulatory Visit | Attending: Obstetrics & Gynecology

## 2013-08-28 LAB — CBC
HCT: 35.7 % — ABNORMAL LOW (ref 36.0–46.0)
Hemoglobin: 12.4 g/dL (ref 12.0–15.0)
MCH: 30.7 pg (ref 26.0–34.0)
MCV: 88.4 fL (ref 78.0–100.0)
RBC: 4.04 MIL/uL (ref 3.87–5.11)

## 2013-08-28 LAB — COMPREHENSIVE METABOLIC PANEL
CO2: 21 mEq/L (ref 19–32)
Calcium: 9.1 mg/dL (ref 8.4–10.5)
Chloride: 102 mEq/L (ref 96–112)
Creatinine, Ser: 0.59 mg/dL (ref 0.50–1.10)
GFR calc Af Amer: >90 mL/min (ref >90)
GFR calc non Af Amer: >90 mL/min (ref >90)
Glucose, Bld: 91 mg/dL (ref 70–99)
Total Bilirubin: 0.3 mg/dL (ref 0.3–1.2)

## 2013-08-28 LAB — LACTATE DEHYDROGENASE: LDH: 158 U/L (ref 94–250)

## 2013-08-28 SURGERY — Surgical Case
Anesthesia: Epidural | Site: Abdomen | Wound class: Clean Contaminated

## 2013-08-28 MED ORDER — ONDANSETRON HCL 4 MG PO TABS: 4.0000 mg | ORAL_TABLET | ORAL | Status: DC | PRN

## 2013-08-28 MED ORDER — OXYTOCIN 40 UNITS IN LACTATED RINGERS INFUSION - SIMPLE MED
1.0000 m[IU]/min | INTRAVENOUS | Status: DC
  Administered 2013-08-28: 1 m[IU]/min via INTRAVENOUS
  Filled 2013-08-28: qty 1000

## 2013-08-28 MED ORDER — ZOLPIDEM TARTRATE 5 MG PO TABS: 5.0000 mg | ORAL_TABLET | Freq: Every evening | ORAL | Status: DC | PRN

## 2013-08-28 MED ORDER — METOCLOPRAMIDE HCL 5 MG/ML IJ SOLN: 10.0000 mg | Freq: Three times a day (TID) | INTRAMUSCULAR | Status: DC | PRN

## 2013-08-28 MED ORDER — FERROUS SULFATE 325 (65 FE) MG PO TABS
325.0000 mg | ORAL_TABLET | Freq: Two times a day (BID) | ORAL | Status: DC
  Administered 2013-08-29 – 2013-08-30 (×3): 325 mg via ORAL
  Filled 2013-08-28 (×3): qty 1

## 2013-08-28 MED ORDER — DIPHENHYDRAMINE HCL 25 MG PO CAPS: 25.0000 mg | ORAL_CAPSULE | ORAL | Status: DC | PRN

## 2013-08-28 MED ORDER — CEFAZOLIN SODIUM-DEXTROSE 2-3 GM-% IV SOLR
INTRAVENOUS | Status: DC | PRN
  Administered 2013-08-28: 2 g via INTRAVENOUS

## 2013-08-28 MED ORDER — OXYTOCIN 40 UNITS IN LACTATED RINGERS INFUSION - SIMPLE MED: 1.0000 m[IU]/min | INTRAVENOUS | Status: DC

## 2013-08-28 MED ORDER — 0.9 % SODIUM CHLORIDE (POUR BTL) OPTIME
TOPICAL | Status: DC | PRN
  Administered 2013-08-28: 1000 mL

## 2013-08-28 MED ORDER — DIPHENHYDRAMINE HCL 50 MG/ML IJ SOLN: 12.5000 mg | INTRAMUSCULAR | Status: DC | PRN

## 2013-08-28 MED ORDER — SENNOSIDES-DOCUSATE SODIUM 8.6-50 MG PO TABS
2.0000 | ORAL_TABLET | ORAL | Status: DC
  Administered 2013-08-29: 2 via ORAL
  Filled 2013-08-28: qty 2

## 2013-08-28 MED ORDER — MEPERIDINE HCL 25 MG/ML IJ SOLN: 6.2500 mg | INTRAMUSCULAR | Status: DC | PRN

## 2013-08-28 MED ORDER — KETOROLAC TROMETHAMINE 30 MG/ML IJ SOLN
INTRAMUSCULAR | Status: AC
  Filled 2013-08-28: qty 1

## 2013-08-28 MED ORDER — SODIUM BICARBONATE 8.4 % IV SOLN
INTRAVENOUS | Status: DC | PRN
  Administered 2013-08-28: 5 mL via EPIDURAL
  Administered 2013-08-28: 3 mL via EPIDURAL
  Administered 2013-08-28 (×2): 5 mL via EPIDURAL
  Administered 2013-08-28: 2 mL via EPIDURAL

## 2013-08-28 MED ORDER — DIBUCAINE 1 % RE OINT: 1.0000 "application " | TOPICAL_OINTMENT | RECTAL | Status: DC | PRN

## 2013-08-28 MED ORDER — OXYCODONE-ACETAMINOPHEN 5-325 MG PO TABS
1.0000 | ORAL_TABLET | ORAL | Status: DC | PRN
  Administered 2013-08-29 – 2013-08-30 (×4): 1 via ORAL
  Filled 2013-08-28 (×4): qty 1

## 2013-08-28 MED ORDER — MORPHINE SULFATE 0.5 MG/ML IJ SOLN
INTRAMUSCULAR | Status: AC
  Filled 2013-08-28: qty 10

## 2013-08-28 MED ORDER — MORPHINE SULFATE (PF) 0.5 MG/ML IJ SOLN
INTRAMUSCULAR | Status: DC | PRN
  Administered 2013-08-28: 1 mg via INTRAVENOUS

## 2013-08-28 MED ORDER — MAGNESIUM HYDROXIDE 400 MG/5ML PO SUSP: 30.0000 mL | ORAL | Status: DC | PRN

## 2013-08-28 MED ORDER — PHENYLEPHRINE 40 MCG/ML (10ML) SYRINGE FOR IV PUSH (FOR BLOOD PRESSURE SUPPORT)
80.0000 ug | PREFILLED_SYRINGE | INTRAVENOUS | Status: AC | PRN
  Administered 2013-08-28 (×7): 80 ug via INTRAVENOUS
  Administered 2013-08-28: 60 ug via INTRAVENOUS
  Administered 2013-08-28: 40 ug via INTRAVENOUS
  Administered 2013-08-28: 80 ug via INTRAVENOUS
  Administered 2013-08-28: 40 ug via INTRAVENOUS
  Administered 2013-08-28: 80 ug via INTRAVENOUS
  Filled 2013-08-28: qty 10

## 2013-08-28 MED ORDER — FENTANYL CITRATE 0.05 MG/ML IJ SOLN: 25.0000 ug | INTRAMUSCULAR | Status: DC | PRN

## 2013-08-28 MED ORDER — LACTATED RINGERS IV SOLN
500.0000 mL | Freq: Once | INTRAVENOUS | Status: AC
  Administered 2013-08-28: 500 mL via INTRAVENOUS

## 2013-08-28 MED ORDER — SIMETHICONE 80 MG PO CHEW
80.0000 mg | CHEWABLE_TABLET | ORAL | Status: DC
  Administered 2013-08-29: 80 mg via ORAL
  Filled 2013-08-28: qty 1

## 2013-08-28 MED ORDER — PHENYLEPHRINE 40 MCG/ML (10ML) SYRINGE FOR IV PUSH (FOR BLOOD PRESSURE SUPPORT)
PREFILLED_SYRINGE | INTRAVENOUS | Status: AC
  Filled 2013-08-28: qty 15

## 2013-08-28 MED ORDER — CEFAZOLIN SODIUM-DEXTROSE 2-3 GM-% IV SOLR
INTRAVENOUS | Status: AC
  Filled 2013-08-28: qty 50

## 2013-08-28 MED ORDER — SIMETHICONE 80 MG PO CHEW
80.0000 mg | CHEWABLE_TABLET | Freq: Three times a day (TID) | ORAL | Status: DC
  Administered 2013-08-29 – 2013-08-30 (×4): 80 mg via ORAL
  Filled 2013-08-28 (×4): qty 1

## 2013-08-28 MED ORDER — MENTHOL 3 MG MT LOZG: 1.0000 | LOZENGE | OROMUCOSAL | Status: DC | PRN

## 2013-08-28 MED ORDER — NALOXONE HCL 0.4 MG/ML IJ SOLN: 0.4000 mg | INTRAMUSCULAR | Status: DC | PRN

## 2013-08-28 MED ORDER — ONDANSETRON HCL 4 MG/2ML IJ SOLN: 4.0000 mg | INTRAMUSCULAR | Status: DC | PRN

## 2013-08-28 MED ORDER — KETOROLAC TROMETHAMINE 30 MG/ML IJ SOLN
30.0000 mg | Freq: Once | INTRAMUSCULAR | Status: AC
  Administered 2013-08-28: 30 mg via INTRAVENOUS

## 2013-08-28 MED ORDER — SCOPOLAMINE 1 MG/3DAYS TD PT72
1.0000 | MEDICATED_PATCH | Freq: Once | TRANSDERMAL | Status: DC
  Administered 2013-08-28: 1.5 mg via TRANSDERMAL

## 2013-08-28 MED ORDER — EPHEDRINE 5 MG/ML INJ
10.0000 mg | INTRAVENOUS | Status: DC | PRN
  Filled 2013-08-28: qty 4

## 2013-08-28 MED ORDER — SODIUM CHLORIDE 0.9 % IJ SOLN: 3.0000 mL | INTRAMUSCULAR | Status: DC | PRN

## 2013-08-28 MED ORDER — ONDANSETRON HCL 4 MG/2ML IJ SOLN
INTRAMUSCULAR | Status: DC | PRN
  Administered 2013-08-28: 4 mg via INTRAVENOUS

## 2013-08-28 MED ORDER — BUPIVACAINE HCL (PF) 0.25 % IJ SOLN
INTRAMUSCULAR | Status: DC | PRN
  Administered 2013-08-28: 30 mL

## 2013-08-28 MED ORDER — LACTATED RINGERS IV SOLN
INTRAVENOUS | Status: DC
  Administered 2013-08-29: 15:00:00 via INTRAVENOUS

## 2013-08-28 MED ORDER — PHENYLEPHRINE 40 MCG/ML (10ML) SYRINGE FOR IV PUSH (FOR BLOOD PRESSURE SUPPORT): 80.0000 ug | PREFILLED_SYRINGE | INTRAVENOUS | Status: DC | PRN

## 2013-08-28 MED ORDER — ONDANSETRON HCL 4 MG/2ML IJ SOLN: 4.0000 mg | Freq: Three times a day (TID) | INTRAMUSCULAR | Status: DC | PRN

## 2013-08-28 MED ORDER — OXYTOCIN 40 UNITS IN LACTATED RINGERS INFUSION - SIMPLE MED: 62.5000 mL/h | INTRAVENOUS | Status: AC

## 2013-08-28 MED ORDER — LACTATED RINGERS IV SOLN
INTRAVENOUS | Status: DC | PRN
  Administered 2013-08-28: 20:00:00 via INTRAVENOUS

## 2013-08-28 MED ORDER — LIDOCAINE HCL (PF) 1 % IJ SOLN
INTRAMUSCULAR | Status: DC | PRN
  Administered 2013-08-28 (×2): 5 mL

## 2013-08-28 MED ORDER — PHENYLEPHRINE 40 MCG/ML (10ML) SYRINGE FOR IV PUSH (FOR BLOOD PRESSURE SUPPORT)
PREFILLED_SYRINGE | INTRAVENOUS | Status: AC
  Filled 2013-08-28: qty 5

## 2013-08-28 MED ORDER — TERBUTALINE SULFATE 1 MG/ML IJ SOLN: 0.2500 mg | Freq: Once | INTRAMUSCULAR | Status: DC | PRN

## 2013-08-28 MED ORDER — NALBUPHINE HCL 10 MG/ML IJ SOLN
5.0000 mg | INTRAMUSCULAR | Status: DC | PRN
  Filled 2013-08-28: qty 1

## 2013-08-28 MED ORDER — MEPERIDINE HCL 25 MG/ML IJ SOLN
INTRAMUSCULAR | Status: AC
  Filled 2013-08-28: qty 1

## 2013-08-28 MED ORDER — BUPIVACAINE HCL (PF) 0.25 % IJ SOLN
INTRAMUSCULAR | Status: AC
  Filled 2013-08-28: qty 30

## 2013-08-28 MED ORDER — NALOXONE HCL 1 MG/ML IJ SOLN
1.0000 ug/kg/h | INTRAVENOUS | Status: DC | PRN
  Filled 2013-08-28: qty 2

## 2013-08-28 MED ORDER — MORPHINE SULFATE (PF) 0.5 MG/ML IJ SOLN
INTRAMUSCULAR | Status: DC | PRN
  Administered 2013-08-28: 4 mg via EPIDURAL

## 2013-08-28 MED ORDER — TETANUS-DIPHTH-ACELL PERTUSSIS 5-2.5-18.5 LF-MCG/0.5 IM SUSP: 0.5000 mL | Freq: Once | INTRAMUSCULAR | Status: DC

## 2013-08-28 MED ORDER — DIPHENHYDRAMINE HCL 25 MG PO CAPS: 25.0000 mg | ORAL_CAPSULE | Freq: Four times a day (QID) | ORAL | Status: DC | PRN

## 2013-08-28 MED ORDER — LANOLIN HYDROUS EX OINT: 1.0000 "application " | TOPICAL_OINTMENT | CUTANEOUS | Status: DC | PRN

## 2013-08-28 MED ORDER — LACTATED RINGERS IV SOLN
INTRAVENOUS | Status: DC | PRN
  Administered 2013-08-28: 21:00:00 via INTRAVENOUS

## 2013-08-28 MED ORDER — FENTANYL 2.5 MCG/ML BUPIVACAINE 1/10 % EPIDURAL INFUSION (WH - ANES)
14.0000 mL/h | INTRAMUSCULAR | Status: DC | PRN
  Administered 2013-08-28 (×2): 14 mL/h via EPIDURAL
  Filled 2013-08-28 (×2): qty 125

## 2013-08-28 MED ORDER — OXYTOCIN 10 UNIT/ML IJ SOLN
INTRAMUSCULAR | Status: AC
  Filled 2013-08-28: qty 4

## 2013-08-28 MED ORDER — SCOPOLAMINE 1 MG/3DAYS TD PT72
MEDICATED_PATCH | TRANSDERMAL | Status: AC
  Filled 2013-08-28: qty 1

## 2013-08-28 MED ORDER — DIPHENHYDRAMINE HCL 50 MG/ML IJ SOLN: 25.0000 mg | INTRAMUSCULAR | Status: DC | PRN

## 2013-08-28 MED ORDER — ONDANSETRON HCL 4 MG/2ML IJ SOLN
INTRAMUSCULAR | Status: AC
  Filled 2013-08-28: qty 2

## 2013-08-28 MED ORDER — WITCH HAZEL-GLYCERIN EX PADS: 1.0000 "application " | MEDICATED_PAD | CUTANEOUS | Status: DC | PRN

## 2013-08-28 MED ORDER — IBUPROFEN 600 MG PO TABS
600.0000 mg | ORAL_TABLET | Freq: Four times a day (QID) | ORAL | Status: DC
  Administered 2013-08-29 – 2013-08-30 (×5): 600 mg via ORAL
  Filled 2013-08-28 (×5): qty 1

## 2013-08-28 MED ORDER — MEPERIDINE HCL 25 MG/ML IJ SOLN
INTRAMUSCULAR | Status: DC | PRN
  Administered 2013-08-28 (×2): 12.5 mg via INTRAVENOUS

## 2013-08-28 MED ORDER — EPHEDRINE 5 MG/ML INJ: 10.0000 mg | INTRAVENOUS | Status: DC | PRN

## 2013-08-28 MED ORDER — SIMETHICONE 80 MG PO CHEW: 80.0000 mg | CHEWABLE_TABLET | ORAL | Status: DC | PRN

## 2013-08-28 MED ORDER — PRENATAL MULTIVITAMIN CH
1.0000 | ORAL_TABLET | Freq: Every day | ORAL | Status: DC
  Administered 2013-08-29 – 2013-08-30 (×2): 1 via ORAL
  Filled 2013-08-28 (×2): qty 1

## 2013-08-28 SURGICAL SUPPLY — 38 items
CLAMP CORD UMBIL (MISCELLANEOUS) IMPLANT
CLOTH BEACON ORANGE TIMEOUT ST (SAFETY) ×2 IMPLANT
CONTAINER PREFILL 10% NBF 15ML (MISCELLANEOUS) IMPLANT
DRAPE LG THREE QUARTER DISP (DRAPES) IMPLANT
DRSG OPSITE POSTOP 4X10 (GAUZE/BANDAGES/DRESSINGS) ×2 IMPLANT
DURAPREP 26ML APPLICATOR (WOUND CARE) ×2 IMPLANT
ELECT REM PT RETURN 9FT ADLT (ELECTROSURGICAL) ×2
ELECTRODE REM PT RTRN 9FT ADLT (ELECTROSURGICAL) ×1 IMPLANT
EXTRACTOR VACUUM M CUP 4 TUBE (SUCTIONS) IMPLANT
GLOVE BIO SURGEON STRL SZ 6.5 (GLOVE) ×2 IMPLANT
GLOVE BIOGEL PI IND STRL 7.0 (GLOVE) ×1 IMPLANT
GLOVE BIOGEL PI INDICATOR 7.0 (GLOVE) ×1
GOWN PREVENTION PLUS XLARGE (GOWN DISPOSABLE) ×4 IMPLANT
GOWN STRL REIN XL XLG (GOWN DISPOSABLE) ×4 IMPLANT
KIT ABG SYR 3ML LUER SLIP (SYRINGE) IMPLANT
NEEDLE HYPO 22GX1.5 SAFETY (NEEDLE) ×2 IMPLANT
NEEDLE HYPO 25X5/8 SAFETYGLIDE (NEEDLE) IMPLANT
PACK C SECTION WH (CUSTOM PROCEDURE TRAY) ×2 IMPLANT
PAD OB MATERNITY 4.3X12.25 (PERSONAL CARE ITEMS) ×2 IMPLANT
RTRCTR C-SECT PINK 25CM LRG (MISCELLANEOUS) ×2 IMPLANT
SPONGE LAP 18X18 X RAY DECT (DISPOSABLE) ×2 IMPLANT
STAPLER VISISTAT 35W (STAPLE) ×2 IMPLANT
SUT PLAIN 0 NONE (SUTURE) IMPLANT
SUT PLAIN 2 0 XLH (SUTURE) ×2 IMPLANT
SUT VIC AB 0 CT1 27 (SUTURE) ×4
SUT VIC AB 0 CT1 27XBRD ANBCTR (SUTURE) ×4 IMPLANT
SUT VIC AB 0 CTX 36 (SUTURE) ×2
SUT VIC AB 0 CTX36XBRD ANBCTRL (SUTURE) ×2 IMPLANT
SUT VIC AB 2-0 CT1 27 (SUTURE) ×1
SUT VIC AB 2-0 CT1 TAPERPNT 27 (SUTURE) ×1 IMPLANT
SUT VIC AB 2-0 SH 27 (SUTURE) ×1
SUT VIC AB 2-0 SH 27XBRD (SUTURE) ×1 IMPLANT
SUT VIC AB 3-0 SH 27 (SUTURE)
SUT VIC AB 3-0 SH 27X BRD (SUTURE) IMPLANT
SYR CONTROL 10ML LL (SYRINGE) ×2 IMPLANT
TOWEL OR 17X24 6PK STRL BLUE (TOWEL DISPOSABLE) ×2 IMPLANT
TRAY FOLEY CATH 14FR (SET/KITS/TRAYS/PACK) ×2 IMPLANT
WATER STERILE IRR 1000ML POUR (IV SOLUTION) IMPLANT

## 2013-08-28 NOTE — Progress Notes (Signed)
[name] is a 27 y.o. G1P0 at [redacted]w[redacted]d by LMP admitted for induction of labor due to Hypertension.  Subjective: Comfortable.  Objective: BP 104/66  Pulse 72  Temp(Src) 98.2 F (36.8 C) (Oral)  Resp 18  Ht 5\' 6"  (1.676 m)  Wt 117.028 kg (258 lb)  BMI 41.66 kg/m2  SpO2 99%  LMP 09/18/2012      FHT:  FHR: 150-155 bpm, variability: moderate,  accelerations:  Present,  decelerations:  Absent UC:   regular, every 2 minutes (100-150MVU by IUPC) SVE:   Dilation: 6.5 Effacement (%): 90 Station: -1 Exam by:: [name]  Labs: Lab Results  Component Value Date   WBC 12.1* 08/28/2013   HGB 12.4 08/28/2013   HCT 35.7* 08/28/2013   MCV 88.4 08/28/2013   PLT 251 08/28/2013    Assessment / Plan: Induction of labor due to gestational hypertension,  progressing well on pitocin Presumed LGA Adequate cervical change despite inadequate MVU. IUPC changed. Meconium stained AF  Labor: Progressing normally Preeclampsia:  no signs or symptoms of toxicity, intake and ouput balanced and labs stable Fetal Wellbeing:  Category I Pain Control:  Epidural I/D:  n/a Anticipated MOD:  Cautious approach to VD  [name] J 08/28/2013, 4:56 PM

## 2013-08-28 NOTE — Anesthesia Procedure Notes (Signed)
Epidural Patient location during procedure: OB Start time: 08/28/2013 11:53 AM  Staffing Anesthesiologist: Brayton Caves Performed by: anesthesiologist   Preanesthetic Checklist Completed: patient identified, site marked, surgical consent, pre-op evaluation, timeout performed, IV checked, risks and benefits discussed and monitors and equipment checked  Epidural Patient position: sitting Prep: site prepped and draped and DuraPrep Patient monitoring: continuous pulse ox and blood pressure Approach: midline Injection technique: LOR air  Needle:  Needle type: Tuohy  Needle gauge: 17 G Needle length: 9 cm and 9 Needle insertion depth: 8 cm Catheter type: closed end flexible Catheter size: 19 Gauge Catheter at skin depth: 12 cm Test dose: negative  Assessment Events: blood not aspirated, injection not painful, no injection resistance, negative IV test and no paresthesia  Additional Notes Patient identified.  Risk benefits discussed including failed block, incomplete pain control, headache, nerve damage, paralysis, blood pressure changes, nausea, vomiting, reactions to medication both toxic or allergic, and postpartum back pain.  Patient expressed understanding and wished to proceed.  All questions were answered.  Sterile technique used throughout procedure and epidural site dressed with sterile barrier dressing. No paresthesia or other complications noted.The patient did not experience any signs of intravascular injection such as tinnitus or metallic taste in mouth nor signs of intrathecal spread such as rapid motor block. Please see nursing notes for vital signs.

## 2013-08-28 NOTE — Anesthesia Postprocedure Evaluation (Signed)
  Anesthesia Post-op Note  Patient: [name]  Procedure(s) Performed: Procedure(s): CESAREAN SECTION (N/A)  Patient Location: PACU  Anesthesia Type:Epidural  Level of Consciousness: awake, alert  and oriented  Airway and Oxygen Therapy: Patient Spontanous Breathing  Post-op Pain: none  Post-op Assessment: Post-op Vital signs reviewed, Patient's Cardiovascular Status Stable, Respiratory Function Stable, Patent Airway, No signs of Nausea or vomiting, Pain level controlled, No headache and No backache  Post-op Vital Signs: Reviewed and stable  Complications: No apparent anesthesia complications

## 2013-08-28 NOTE — Progress Notes (Signed)
Induction PIH x Friday pm with Cytotec, Foley, Pitocin and AROM Subjective: Doing well, pain controled with epidural, UCs q3 min  Anesthesia epidural   Objective: BP 138/73  Pulse 104  Temp(Src) 101 F (38.3 C) (Axillary)  Resp 20  Ht 5\' 6"  (1.676 m)  Wt 117.028 kg (258 lb)  BMI 41.66 kg/m2  SpO2 99%  LMP 09/18/2012   FHT:  180's with mild to deep variable decelerations UC:   regular, every 3 minutes VE:   Dilation: 10 Effacement (%): 90 Station: 0 Exam by:: [employer]  Pushed x about 1:30 hr with good expulsive forces. Minimal progression in last 30 min.  FHR tachy at 180/min with deep variable decelerations with last 3 UCs.    Assessment / Plan: Induction for PIH.  Long process.  Suspicion of macrosomia.  Arrest of descent.  Fetal tachy with maternal fever.  Fetal Wellbeing:  Category II Pain Control:  Epidural  Anticipated MOD:  Decision to proceed with urgent primary C/S.  Consent obtained.  Kathe Wirick,MARIE-LYNE 08/28/2013, 8:24 PM

## 2013-08-28 NOTE — Anesthesia Preprocedure Evaluation (Addendum)
Anesthesia Evaluation  Patient identified by MRN, date of birth, ID band Patient awake    Reviewed: Allergy & Precautions, H&P , Patient's Chart, lab work & pertinent test results  Airway Mallampati: III TM Distance: >3 FB Neck ROM: full    Dental   Pulmonary  breath sounds clear to auscultation        Cardiovascular hypertension, Rhythm:regular Rate:Normal     Neuro/Psych PSYCHIATRIC DISORDERS Anxiety    GI/Hepatic   Endo/Other  Morbid obesity  Renal/GU      Musculoskeletal   Abdominal   Peds  Hematology   Anesthesia Other Findings   Reproductive/Obstetrics (+) Pregnancy                          Anesthesia Physical Anesthesia Plan  ASA: III and emergent  Anesthesia Plan: Epidural   Post-op Pain Management:    Induction:   Airway Management Planned: Natural Airway  Additional Equipment:   Intra-op Plan:   Post-operative Plan:   Informed Consent: I have reviewed the patients History and Physical, chart, labs and discussed the procedure including the risks, benefits and alternatives for the proposed anesthesia with the patient or authorized representative who has indicated his/her understanding and acceptance.     Plan Discussed with: Anesthesiologist, CRNA and Surgeon  Anesthesia Plan Comments: (Patient for urgent C/Section for non-reassuring FHR tracing and failure to progress.)       Anesthesia Quick Evaluation

## 2013-08-28 NOTE — Progress Notes (Signed)
[name] is a 27 y.o. G1P0 at [redacted]w[redacted]d by LMP admitted for induction of labor due to Hypertension.  Subjective: Getting uncomfortable No headache, no visual sxs,   Objective: BP 123/82  Pulse 75  Temp(Src) 98.2 F (36.8 C) (Oral)  Resp 18  Ht 5\' 6"  (1.676 m)  Wt 117.028 kg (258 lb)  BMI 41.66 kg/m2  SpO2 96%  LMP 09/18/2012      FHT:  FHR: 145 bpm, variability: moderate,  accelerations:  Present,  decelerations:  Absent UC:   regular, every 3 minutes SVE:   Dilation: 4.5 Effacement (%): 70 Station: -2 Exam by:: Teresa Nicodemus AROM- lt mec  Labs: Lab Results  Component Value Date   WBC 12.1* 08/28/2013   HGB 12.4 08/28/2013   HCT 35.7* 08/28/2013   MCV 88.4 08/28/2013   PLT 251 08/28/2013   CMP     Component Value Date/Time   NA 136 08/28/2013 0707   K 3.8 08/28/2013 0707   CL 102 08/28/2013 0707   CO2 21 08/28/2013 0707   GLUCOSE 91 08/28/2013 0707   BUN 5* 08/28/2013 0707   CREATININE 0.59 08/28/2013 0707   CALCIUM 9.1 08/28/2013 0707   PROT 6.4 08/28/2013 0707   ALBUMIN 2.8* 08/28/2013 0707   AST 23 08/28/2013 0707   ALT 21 08/28/2013 0707   ALKPHOS 150* 08/28/2013 0707   BILITOT 0.3 08/28/2013 0707   GFRNONAA >90 08/28/2013 0707   GFRAA >90 08/28/2013 0707     Assessment / Plan: Induction of labor due to gestational hypertension,  progressing well on pitocin LGA Meconium ROM  Labor: Progressing normally Preeclampsia:  no signs or symptoms of toxicity, intake and ouput balanced and labs stable Fetal Wellbeing:  Category I Pain Control:  Labor support without medications I/D:  n/a Anticipated MOD:  Cautious approach to VD  [name] J 08/28/2013, 11:30 AM

## 2013-08-28 NOTE — Transfer of Care (Signed)
Immediate Anesthesia Transfer of Care Note  Patient: [name]  Procedure(s) Performed: Procedure(s): CESAREAN SECTION (N/A)  Patient Location: PACU  Anesthesia Type:Epidural  Level of Consciousness: awake, alert  and oriented  Airway & Oxygen Therapy: Patient Spontanous Breathing  Post-op Assessment: Report given to PACU RN and Post -op Vital signs reviewed and stable  Post vital signs: Reviewed and stable  Complications: No apparent anesthesia complications

## 2013-08-28 NOTE — Op Note (Signed)
Preoperative diagnosis: Intrauterine pregnancy at 39+ weeks                                             Induction PIH                                            Fetal tachycardia with maternal fever                                            Arrest of descent   Post operative diagnosis: Same  Anesthesia: Epidural  Anesthesiologist: Dr. Malen Gauze  Procedure: Primary Urgent low transverse cesarean section  Surgeon: Dr. Genia Del  Assistant: None   Estimated blood loss: 800 cc  Procedure:  After being informed of the planned procedure and possible complications including bleeding, infection, injury to other organs, informed consent is obtained. The patient is taken to OR #9 and the level of epidural anesthesia is raised without complication. She is placed in the dorsal decubitus position with the pelvis tilted to the left. She is then prepped and draped in a sterile fashion. A Foley catheter is inserted in her bladder.  After assessing adequate level of anesthesia, we infiltrate the suprapubic area with 20 cc of Marcaine 0.25 and perform a Pfannenstiel incision which is brought down sharply to the fascia. The fascia is entered in a low transverse fashion. Linea alba is dissected. Peritoneum is entered in a midline fashion. An Alexis retractor is easily positioned. Visceral peritoneum is entered in a low transverse fashion allowing [place] to safely retract bladder by developing a bladder flap.  The myometrium is then entered in a low transverse fashion; first with knife and then extended bluntly. Amniotic fluid is tinted meconium. We assist the birth of a female  infant in cephalic presentation. Mouth and nose are suctioned. The baby is delivered. The cord is clamped and sectioned. The baby is given to the neonatologist present in the room.  10 cc of blood is drawn from the umbilical vein.  PH is 7.19.  The placenta is allowed to deliver spontaneously. It is complete and the cord has 3 vessels.  Uterine revision is negative.  We proceed with closure of the myometrium in 2 layers: First with a running locked suture of 0 Vicryl, then with a Lembert suture of 0 Vicryl imbricating the first one. Hemostasis is completed with cauterization on peritoneal edges.  Both paracolic gutters are cleaned. Both tubes and ovaries are assessed and normal.  Satisfactory hemostasis is confirmed.  Retractors and sponges are removed. Under fascia hemostasis is completed with cauterization.  The parietal peritoneum is closed with a running suture of Vicryl 2-0.  The fascia is then closed with 2 running sutures of 0 Vicryl meeting midline. The wound is irrigated with warm saline and hemostasis is completed with cauterization.  The adipose tissue is reapproximated with a running suture of Plain 2-0. The skin is closed with staples and a Honeycomb dressing is applied.  Instrument and sponge count is complete x2. Estimated blood loss is 800 cc.  The procedure is well tolerated by the patient who is taken to recovery room in  a well and stable condition.  female baby named Eliberto Ivory was born at 20:24 and received an Apgar of 9  at 1 minute and 9 at 5 minutes. Weight was pending.   Specimen: Placenta sent to patho.   Navi Erber,MARIE-LYNE MD 11/23/20149:29 PM

## 2013-08-29 ENCOUNTER — Encounter (HOSPITAL_COMMUNITY): Payer: Self-pay | Admitting: Obstetrics and Gynecology

## 2013-08-29 DIAGNOSIS — Z98891 History of uterine scar from previous surgery: Secondary | ICD-10-CM

## 2013-08-29 LAB — CBC
HCT: 27.8 % — ABNORMAL LOW (ref 36.0–46.0)
Hemoglobin: 9.7 g/dL — ABNORMAL LOW (ref 12.0–15.0)
MCH: 30.7 pg (ref 26.0–34.0)
MCHC: 34.9 g/dL (ref 30.0–36.0)
RBC: 3.16 MIL/uL — ABNORMAL LOW (ref 3.87–5.11)
RDW: 12.7 % (ref 11.5–15.5)
WBC: 16 10*3/uL — ABNORMAL HIGH (ref 4.0–10.5)

## 2013-08-29 MED ORDER — KETOROLAC TROMETHAMINE 30 MG/ML IJ SOLN
30.0000 mg | Freq: Once | INTRAMUSCULAR | Status: AC
  Administered 2013-08-29: 30 mg via INTRAVENOUS
  Filled 2013-08-29: qty 1

## 2013-08-29 NOTE — Anesthesia Postprocedure Evaluation (Signed)
Anesthesia Post Note  Patient: [name]  Procedure(s) Performed: Procedure(s) (LRB): CESAREAN SECTION (N/A)  Anesthesia type: Epidural  Patient location: Mother/Baby  Post pain: Pain level controlled  Post assessment: Post-op Vital signs reviewed  Last Vitals:  Filed Vitals:   08/29/13 0645  BP: 105/68  Pulse: 67  Temp: 36.7 C  Resp: 20    Post vital signs: Reviewed  Level of consciousness: awake  Complications: No apparent anesthesia complications

## 2013-08-29 NOTE — Progress Notes (Signed)
Patient ID: [name], female   DOB: 01-Feb-1986, 27 y.o.   MRN: [id number] POD # 1  Subjective: Pt reports feeling well/ Pain controlled with motrin and intraop meds Tolerating po/ Foley patent and draining clear, yellow urine/ No n/v/Flatus neg Activity: up with assistance; SCD's in place Bleeding is light Newborn info:  Information for the patient's newborn:  Trinaty, Bundrick [[id number]]  female  / circ performed this am per Dr Seymour Bars Feeding: breast   Objective: VS: Blood pressure 106/70, pulse 73, temperature 97.7 F (36.5 C), temperature source Oral, resp. rate 20.    Intake/Output Summary (Last 24 hours) at 08/29/13 1025 Last data filed at 08/29/13 0900  Gross per 24 hour  Intake   1785 ml  Output   2250 ml  Net   -465 ml      Recent Labs  08/28/13 0707 08/29/13 0610  WBC 12.1* 16.0*  HGB 12.4 9.7*  HCT 35.7* 27.8*  PLT 251 182    Blood type: --/--/A POS, A POS (11/21 0955) Rubella: Immune (04/15 0000)    Physical Exam:  General: alert, cooperative and no distress CV: Regular rate and rhythm Resp: clear Abdomen: soft, nontender, normal bowel sounds Incision: Covered with Tegaderm and honeycomb dressing; well approximated. Uterine Fundus: firm, below umbilicus, nontender Lochia: minimal Ext: no edema, redness or tenderness in the calves or thighs and SVD's in place    A/P: POD # 1/ G1P1001 S/P Primary C/Section d/t maternal fever, arrest of descent, macrosomia, fetal tachycardia Afebrile x 16 hours Gest HTN is stable. Doing well Continue routine post op orders   Signed: Demetrius Revel, MSN, [facility] 08/29/2013, 10:25 AM

## 2013-08-30 ENCOUNTER — Encounter (HOSPITAL_COMMUNITY): Payer: Self-pay | Admitting: Obstetrics & Gynecology

## 2013-08-30 LAB — TYPE AND SCREEN: Unit division: 0

## 2013-08-30 MED ORDER — IBUPROFEN 600 MG PO TABS: 600.0000 mg | ORAL_TABLET | Freq: Four times a day (QID) | ORAL | Status: DC | PRN

## 2013-08-30 MED ORDER — OXYCODONE-ACETAMINOPHEN 5-325 MG PO TABS: 1.0000 | ORAL_TABLET | ORAL | Status: DC | PRN

## 2013-08-30 MED ORDER — FERROUS SULFATE 325 (65 FE) MG PO TABS: 325.0000 mg | ORAL_TABLET | Freq: Two times a day (BID) | ORAL | Status: DC

## 2013-08-30 MED ORDER — SENNOSIDES-DOCUSATE SODIUM 8.6-50 MG PO TABS: 2.0000 | ORAL_TABLET | Freq: Two times a day (BID) | ORAL | Status: DC | PRN

## 2013-08-30 NOTE — Plan of Care (Signed)
Problem: Discharge Progression Outcomes Goal: Remove staples per MD order Outcome: Not Applicable Date Met:  08/30/13 Pt is having staples removed in the office

## 2013-08-30 NOTE — Discharge Summary (Signed)
Obstetric Discharge Summary Reason for Admission: G1 P0 @ 39wks for IOL d/t gest HTN./ Mild PEC Prenatal Procedures: NST and ultrasound Intrapartum Procedures: cesarean: low cervical, transverse Postpartum Procedures: none Complications-Operative and Postpartum: none Hemoglobin  Date Value Range Status  08/29/2013 9.7* 12.0 - 15.0 g/dL Final     DELTA CHECK NOTED     REPEATED TO VERIFY     HCT  Date Value Range Status  08/29/2013 27.8* 36.0 - 46.0 % Final    Physical Exam:  General: alert, cooperative and no distress Lochia: appropriate Uterine Fundus: firm Incision: Covered with Tegaderm and honeycomb dressing; well approximated.  DVT Evaluation: No evidence of DVT seen on physical exam. Negative Homan's sign.  Discharge Diagnoses: G1 P1 s/p primary C/S @ 39wks d/t Arrest of descent, maternal fever, fetal tachy, prolonged labor Hx gest HTN; resolving and BP's stable, WNL Mild ABL Anemia; fe supplement  Discharge Information: Date: 08/30/2013 Activity: pelvic rest Diet: routine Medications: PNV, Ibuprofen, Colace, Iron and Percocet Condition: stable Instructions: refer to practice specific booklet Discharge to: home Follow-up Information   Follow up with LAVOIE,MARIE-LYNE, MD In 6 weeks. (Staple removal in the office on Frid, 09/02/13 @ 1030am)    Specialty:  Obstetrics and Gynecology   Contact information:   [address] [place] [address] [phone]       Newborn Data: Live born female on 08/28/13 Eliberto Ivory) Birth Weight: 7 lb 12.7 oz (3535 g) APGAR: 9, 9  Home with mother.  Gillian Meeuwsen K 08/30/2013, 9:46 AM

## 2013-08-30 NOTE — Progress Notes (Signed)
Post discharge chart review completed.  

## 2013-08-30 NOTE — Progress Notes (Signed)
Patient ID: [name], female   DOB: 10-Aug-1986, 27 y.o.   MRN: [id number] POD # 2  Subjective: Pt reports feeling well and eager for d/c home/ Pain controlled with ibuprofen and percocet Tolerating po/Voiding without problems/ No n/v/Flatus pos Activity: out of bed and ambulate Bleeding is light Newborn info:  Information for the patient's newborn:  [name] [[id number]]  female  / circ completed yesterday per Dr Seymour Bars Feeding: bottle   Objective: VS: BP 92/61  Pulse 76  Temp(Src) 98.1 F (36.7 C) (Oral)  Resp 18     LABS:  Recent Labs  08/28/13 0707 08/29/13 0610  WBC 12.1* 16.0*  HGB 12.4 9.7*  PLT 251 182                             Physical Exam:  General: alert, cooperative and no distress CV: Regular rate and rhythm Resp: clear Abdomen: soft, nontender, normal bowel sounds Incision: Covered with Tegaderm and honeycomb dressing; well approximated. Closed with staples Uterine Fundus: firm, below umbilicus, nontender Lochia: minimal Ext: edema trace to +1 and Homans sign is negative, no sign of DVT    A/P: POD # 2/ G1P1001/ S/P Primary C/Section d/t FTP, arrest of descent, mat fever, and fetal tachy Hx Gest HTN; BP's stable Mild ABL Anemia  Doing well and stable for discharge home RX's: Ibuprofen 600mg  po Q 6 hrs prn pain #30 Refill x 1 Percocet 5/325 1 - 2 tabs po every 6 hrs prn pain  #30 No refill Niferex 150mg  po QD/BID #30/#60 Refill x 1 Senokot 1 to 2 times per day prn #60 Ref x 1 Staple removal in the office on Fri 09/02/13 @ 1030 Routine pp visit in 6 weeks.    Signed: Demetrius Revel, MSN, [facility] 08/30/2013, 9:40 AM

## 2014-08-07 ENCOUNTER — Encounter (HOSPITAL_COMMUNITY): Payer: Self-pay | Admitting: Obstetrics & Gynecology

## 2016-09-05 ENCOUNTER — Ambulatory Visit (INDEPENDENT_AMBULATORY_CARE_PROVIDER_SITE_OTHER): Payer: BC Managed Care – PPO | Admitting: Physician Assistant

## 2016-09-05 ENCOUNTER — Encounter: Payer: Self-pay | Admitting: Physician Assistant

## 2016-09-05 VITALS — BP 122/84 | HR 87 | Temp 98.5°F | Resp 16 | Wt 229.0 lb

## 2016-09-05 DIAGNOSIS — Z23 Encounter for immunization: Secondary | ICD-10-CM | POA: Diagnosis not present

## 2016-09-05 DIAGNOSIS — Z0000 Encounter for general adult medical examination without abnormal findings: Secondary | ICD-10-CM | POA: Diagnosis not present

## 2016-09-05 DIAGNOSIS — Z7689 Persons encountering health services in other specified circumstances: Secondary | ICD-10-CM | POA: Diagnosis not present

## 2016-09-05 LAB — CBC WITH DIFFERENTIAL/PLATELET
BASOS PCT: 0 %
Basophils Absolute: 0 cells/uL (ref 0–200)
Eosinophils Absolute: 216 cells/uL (ref 15–500)
Eosinophils Relative: 3 %
HCT: 40.8 % (ref 35.0–45.0)
Hemoglobin: 13.7 g/dL (ref 12.0–15.0)
Lymphocytes Relative: 24 %
Lymphs Abs: 1728 cells/uL (ref 850–3900)
MCH: 29.5 pg (ref 27.0–33.0)
MCHC: 33.6 g/dL (ref 32.0–36.0)
MCV: 87.9 fL (ref 80.0–100.0)
MONO ABS: 360 {cells}/uL (ref 200–950)
MONOS PCT: 5 %
MPV: 9.7 fL (ref 7.5–12.5)
Neutro Abs: 4896 cells/uL (ref 1500–7800)
Neutrophils Relative %: 68 %
PLATELETS: 316 10*3/uL (ref 140–400)
RBC: 4.64 MIL/uL (ref 3.80–5.10)
RDW: 13.5 % (ref 11.0–15.0)
WBC: 7.2 10*3/uL (ref 3.8–10.8)

## 2016-09-05 LAB — LIPID PANEL
CHOL/HDL RATIO: 3.8 ratio (ref <5.0)
CHOLESTEROL: 180 mg/dL (ref <200)
HDL: 47 mg/dL — ABNORMAL LOW (ref >50)
LDL CALC: 112 mg/dL — AB (ref <100)
TRIGLYCERIDES: 106 mg/dL (ref <150)
VLDL: 21 mg/dL (ref <30)

## 2016-09-05 LAB — COMPLETE METABOLIC PANEL WITH GFR
ALT: 12 U/L (ref 6–29)
AST: 11 U/L (ref 10–30)
Albumin: 4.2 g/dL (ref 3.6–5.1)
Alkaline Phosphatase: 62 U/L (ref 33–115)
BUN: 10 mg/dL (ref 7–25)
CHLORIDE: 103 mmol/L (ref 98–110)
CO2: 25 mmol/L (ref 20–31)
Calcium: 9.1 mg/dL (ref 8.6–10.2)
Creat: 0.76 mg/dL (ref 0.50–1.10)
GFR, Est Non African American: >89 mL/min (ref >=60)
Glucose, Bld: 84 mg/dL (ref 70–99)
Potassium: 4.5 mmol/L (ref 3.5–5.3)
Sodium: 139 mmol/L (ref 135–146)
Total Bilirubin: 0.3 mg/dL (ref 0.2–1.2)
Total Protein: 7.3 g/dL (ref 6.1–8.1)

## 2016-09-05 LAB — TSH: TSH: 3.69 mIU/L

## 2016-09-05 NOTE — Progress Notes (Signed)
Patient ID: [name] MRN: [id number], DOB: Dec 15, 1985, 30 y.o. Date of Encounter: 09/05/2016,   Chief Complaint: Physical (CPE)  HPI: 30 y.o. y/o female  here for CPE.   Also being seen as a New Pt to [place].   Says that she was going to a PCP in [place] but would only go there prn.  Does have gynecologist that she sees annually and just went there in March-- goes to [facility].  Says that she thought she should just come in and get "a checkup and check things out"  No specific concerns today.  She has a 40-year-old son and a 17-year-old stepdaughter. She does work--teacher.  She has never smoked. She has no premature family history of CAD or CA  Review of Systems: Consitutional: No fever, chills, fatigue, night sweats, lymphadenopathy. No significant/unexplained weight changes. Eyes: No visual changes, eye redness, or discharge. ENT/Mouth: No ear pain, sore throat, nasal drainage, or sinus pain. Cardiovascular: No chest pressure,heaviness, tightness or squeezing, even with exertion. No increased shortness of breath or dyspnea on exertion.No palpitations, edema, orthopnea, PND. Respiratory: No cough, hemoptysis, SOB, or wheezing. Gastrointestinal: No anorexia, dysphagia, reflux, pain, nausea, vomiting, hematemesis, diarrhea, constipation, BRBPR, or melena. Breast: No mass, nodules, bulging, or retraction. No skin changes or inflammation. No nipple discharge. No lymphadenopathy. Genitourinary: No dysuria, hematuria, incontinence, vaginal discharge, pruritis, burning, abnormal bleeding, or pain. Musculoskeletal: No decreased ROM, No joint pain or swelling. No significant pain in neck, back, or extremities. Skin: No rash, pruritis, or concerning lesions. Neurological: No headache, dizziness, syncope, seizures, tremors, memory loss, coordination problems, or paresthesias. Psychological: No anxiety, depression, hallucinations, SI/HI. Endocrine: No polydipsia,  polyphagia, polyuria, or known diabetes.No increased fatigue. No palpitations/rapid heart rate. No significant/unexplained weight change. All other systems were reviewed and are otherwise negative.  Past Medical History:  Diagnosis Date  . Ankle fracture    hx of rt  . Arm fracture, left   . Panic attacks history of  . Pregnancy induced hypertension in third trimester, postpartum 08/26/2013  . S/P primary low transverse C-section 08/29/2013  . SVD (spontaneous vaginal delivery) 08/29/2013     Past Surgical History:  Procedure Laterality Date  . APPENDECTOMY    . CESAREAN SECTION N/A 08/28/2013   Procedure: CESAREAN SECTION;  Surgeon: Princess Bruins, MD;  Location: [facility];  Service: Obstetrics;  Laterality: N/A;  . CHOLECYSTECTOMY    . tendonitis surgery  2006  . TONSILLECTOMY      Home Meds:  Outpatient Medications Prior to Visit  Medication Sig Dispense Refill  . cetirizine (ZYRTEC) 10 MG tablet Take 10 mg by mouth daily.    Marland Kitchen ibuprofen (ADVIL,MOTRIN) 600 MG tablet Take 1 tablet (600 mg total) by mouth every 6 (six) hours as needed. 30 tablet 1  . acetaminophen (TYLENOL) 500 MG tablet Take 1,000 mg by mouth every 6 (six) hours as needed for headache.    . ferrous sulfate 325 (65 FE) MG tablet Take 1 tablet (325 mg total) by mouth 2 (two) times daily with a meal. (Patient not taking: Reported on 09/05/2016) 30 tablet 1  . oxyCODONE-acetaminophen (PERCOCET/ROXICET) 5-325 MG per tablet Take 1-2 tablets by mouth every 4 (four) hours as needed for severe pain (moderate - severe pain). (Patient not taking: Reported on 09/05/2016) 30 tablet 0  . Prenatal Vit-Fe Fumarate-FA (PRENATAL MULTIVITAMIN) TABS Take 1 tablet by mouth daily.    Marland Kitchen senna-docusate (SENOKOT-S) 8.6-50 MG per tablet Take 2 tablets by mouth 2 (two) times  daily as needed for mild constipation. (Patient not taking: Reported on 09/05/2016) 60 tablet 1   No facility-administered medications prior to visit.     Allergies:  No Known Allergies  Social History   Social History  . Marital status: Married    Spouse name: N/A  . Number of children: N/A  . Years of education: N/A   Occupational History  . Not on file.   Social History Main Topics  . Smoking status: Never Smoker  . Smokeless tobacco: Never Used  . Alcohol use No     Comment: occ use prior to preg  . Drug use: No  . Sexual activity: Yes   Other Topics Concern  . Not on file   Social History Narrative  . No narrative on file    Family History  Problem Relation Age of Onset  . Cancer Maternal Uncle     leukemia  . Cancer Maternal Grandfather     leukemia    Physical Exam: Blood pressure 122/84, pulse 87, temperature 98.5 F (36.9 C), temperature source Oral, resp. rate 16, weight 229 lb (103.9 kg), last menstrual period 08/29/2016, SpO2 98 %, not currently breastfeeding., Body mass index is 36.96 kg/m. General: Obese WF. Appears in no acute distress. HEENT: Normocephalic, atraumatic. Conjunctiva pink, sclera non-icteric. Pupils 2 mm constricting to 1 mm, round, regular, and equally reactive to light and accomodation. EOMI. Internal auditory canal clear. TMs with good cone of light and without pathology. Nasal mucosa pink. Nares are without discharge. No sinus tenderness. Oral mucosa pink.  Pharynx without exudate.   Neck: Supple. Trachea midline. No thyromegaly. Full ROM. No lymphadenopathy.No Carotid Bruits. Lungs: Clear to auscultation bilaterally without wheezes, rales, or rhonchi. Breathing is of normal effort and unlabored. Cardiovascular: RRR with S1 S2. No murmurs, rubs, or gallops. Distal pulses 2+ symmetrically. No carotid or abdominal bruits. Breast: Deferred. Per Gyn. Abdomen: Soft, non-tender, non-distended with normoactive bowel sounds. No hepatosplenomegaly or masses. No rebound/guarding. No CVA tenderness. No hernias.  Genitourinary:  Deferred. Per Gyn. Musculoskeletal: Full range of motion and 5/5 strength  throughout.  Skin: Warm and moist without erythema, ecchymosis, wounds, or rash. Neuro: A+Ox3. CN II-XII grossly intact. Moves all extremities spontaneously. Full sensation throughout. Normal gait. DTR 2+ throughout upper and lower extremities.  Psych:  Responds to questions appropriately with a normal affect.   Assessment/Plan:  30 y.o. y/o female here for CPE  Encounter to establish care  Encounter for preventive health examination  A. Screening Labs: - CBC with Differential/Platelet - COMPLETE METABOLIC PANEL WITH GFR - Lipid panel - TSH   B. Pap: Per Gyn  C. Screening Mammogram: --N/A  D. DEXA/BMD:  --N/A  E. Colorectal Cancer Screening: --N/A  F. Immunizations:  Influenza: She has not had flu vaccine for this season and is agreeable to get this today. Tetanus:  States that she had this 3 years ago when she had her son. Pneumococcal:--N/A Zostavax:--N/A  Follow-up in one year or sooner if needed.  Signed, [address] [place], [place], [facility] 09/05/2016 9:03 AM

## 2016-09-05 NOTE — Addendum Note (Signed)
Addended by: Vonna Kotyk A on: 09/05/2016 12:30 PM   Modules accepted: Orders

## 2016-12-16 ENCOUNTER — Emergency Department (HOSPITAL_COMMUNITY)
Admission: EM | Admit: 2016-12-16 | Discharge: 2016-12-16 | Disposition: A | Discharge status: Home / Self Care | Payer: BC Managed Care – PPO | Attending: Emergency Medicine | Admitting: Emergency Medicine

## 2016-12-16 ENCOUNTER — Emergency Department (HOSPITAL_COMMUNITY): Payer: BC Managed Care – PPO

## 2016-12-16 ENCOUNTER — Encounter (HOSPITAL_COMMUNITY): Payer: Self-pay | Admitting: Emergency Medicine

## 2016-12-16 DIAGNOSIS — R079 Chest pain, unspecified: Secondary | ICD-10-CM

## 2016-12-16 DIAGNOSIS — R0789 Other chest pain: Secondary | ICD-10-CM | POA: Diagnosis present

## 2016-12-16 LAB — CBC
HCT: 40.9 % (ref 36.0–46.0)
Hemoglobin: 13.7 g/dL (ref 12.0–15.0)
MCH: 29.3 pg (ref 26.0–34.0)
MCHC: 33.5 g/dL (ref 30.0–36.0)
MCV: 87.6 fL (ref 78.0–100.0)
Platelets: 317 10*3/uL (ref 150–400)
RBC: 4.67 MIL/uL (ref 3.87–5.11)
RDW: 12.8 % (ref 11.5–15.5)
WBC: 7.5 10*3/uL (ref 4.0–10.5)

## 2016-12-16 LAB — BASIC METABOLIC PANEL
Anion gap: 9 (ref 5–15)
BUN: 8 mg/dL (ref 6–20)
CHLORIDE: 102 mmol/L (ref 101–111)
CO2: 26 mmol/L (ref 22–32)
Calcium: 9.2 mg/dL (ref 8.9–10.3)
Creatinine, Ser: 0.72 mg/dL (ref 0.44–1.00)
GFR calc Af Amer: >60 mL/min (ref >60)
GFR calc non Af Amer: >60 mL/min (ref >60)
Glucose, Bld: 107 mg/dL — ABNORMAL HIGH (ref 65–99)
Potassium: 4.4 mmol/L (ref 3.5–5.1)
SODIUM: 137 mmol/L (ref 135–145)

## 2016-12-16 LAB — I-STAT TROPONIN, ED: Troponin i, poc: 0 ng/mL (ref 0.00–0.08)

## 2016-12-16 MED ORDER — OMEPRAZOLE 20 MG PO CPDR: 20.0000 mg | DELAYED_RELEASE_CAPSULE | Freq: Every day | ORAL | 0 refills | Status: DC

## 2016-12-16 NOTE — ED Triage Notes (Signed)
Patient c/o mid-sternal chest tightness that started this morning at 3am. Denies any shortness of breath, nausea, vomiting, or dizziness. Per patient no cardiac hx. Patient states had pain in neck and shoulders on Friday and pain in upper abd last night. Patient thought pain was related to indigestion but tightness in upper chest started this morning. Per patient family hx of early MIs.

## 2016-12-16 NOTE — ED Provider Notes (Signed)
[facility]ER[facility]ENCY DEPT Provider Note   CSN: [id number] Arrival date & time: 12/16/16  1011     History   Chief Complaint Chief Complaint  Patient presents with  . Chest Pain    HPI [name] is a 31 y.o. female.  HPI Patient presents with chest pain. Dull tightness in the mid chest. Began this morning at 3 AM 104 carotid. Took some Prilosec which helps him. States continues to have some of the pain. Has some dull neck pain couple days ago. No real abdominal pain. No fevers. Does have a grandfather and uncle with early cardiac disease. No swelling in her legs. She does not smoke.   Past Medical History:  Diagnosis Date  . Ankle fracture    hx of rt  . Arm fracture, left   . Panic attacks history of  . Pregnancy induced hypertension in third trimester, postpartum 08/26/2013  . S/P primary low transverse C-section 08/29/2013  . SVD (spontaneous vaginal delivery) 08/29/2013    Patient Active Problem List   Diagnosis Date Noted  . Postpartum care following cesarean delivery (08/28/13) 08/29/2013    Past Surgical History:  Procedure Laterality Date  . APPENDECTOMY    . CESAREAN SECTION N/A 08/28/2013   Procedure: CESAREAN SECTION;  Surgeon: Princess Bruins, MD;  Location: [facility];  Service: Obstetrics;  Laterality: N/A;  . CHOLECYSTECTOMY    . tendonitis surgery  2006  . TONSILLECTOMY      OB History    Gravida Para Term Preterm AB Living   1 1 1     1    SAB TAB Ectopic Multiple Live Births           1       Home Medications    Prior to Admission medications   Medication Sig Start Date End Date Taking? Authorizing Provider  acetaminophen (TYLENOL) 500 MG tablet Take 1,000 mg by mouth every 6 (six) hours as needed for headache.    Historical Provider, MD  cetirizine (ZYRTEC) 10 MG tablet Take 10 mg by mouth daily.    Historical Provider, MD  ferrous sulfate 325 (65 FE) MG tablet Take 1 tablet (325 mg total) by mouth 2 (two) times daily with a  meal. Patient not taking: Reported on 09/05/2016 08/30/13   Gustavo Lah, NP  ibuprofen (ADVIL,MOTRIN) 600 MG tablet Take 1 tablet (600 mg total) by mouth every 6 (six) hours as needed. 08/30/13   Gustavo Lah, NP  Norethindrone Acetate-Ethinyl Estrad-FE (LOESTRIN 24 FE) 1-20 MG-MCG(24) tablet Take 1-20 tablets by mouth daily. 08/01/16   Historical Provider, MD  omeprazole (PRILOSEC) 20 MG capsule Take 1 capsule (20 mg total) by mouth daily. 12/16/16   Davonna Belling, MD  oxyCODONE-acetaminophen (PERCOCET/ROXICET) 5-325 MG per tablet Take 1-2 tablets by mouth every 4 (four) hours as needed for severe pain (moderate - severe pain). Patient not taking: Reported on 09/05/2016 08/30/13   Gustavo Lah, NP  Prenatal Vit-Fe Fumarate-FA (PRENATAL MULTIVITAMIN) TABS Take 1 tablet by mouth daily.    Historical Provider, MD  senna-docusate (SENOKOT-S) 8.6-50 MG per tablet Take 2 tablets by mouth 2 (two) times daily as needed for mild constipation. Patient not taking: Reported on 09/05/2016 08/30/13   Gustavo Lah, NP    Family History Family History  Problem Relation Age of Onset  . Cancer Maternal Uncle     leukemia  . Cancer Maternal Grandfather     leukemia    Social History Social History  Substance Use Topics  . Smoking  status: Never Smoker  . Smokeless tobacco: Never Used  . Alcohol use No     Allergies   Patient has no known allergies.   Review of Systems Review of Systems  Constitutional: Negative for appetite change.  HENT: Negative for congestion.   Respiratory: Negative for shortness of breath.   Cardiovascular: Positive for chest pain.  Gastrointestinal: Negative for abdominal pain.  Genitourinary: Negative for flank pain.  Musculoskeletal: Negative for back pain.  Neurological: Negative for seizures, syncope and numbness.  Hematological: Negative for adenopathy.  Psychiatric/Behavioral: Negative for behavioral problems.     Physical Exam Updated Vital Signs BP  129/89 (BP Location: Right Arm)   Pulse 89   Temp 98.5 F (36.9 C) (Oral)   Resp 18   Ht 5\' 6"  (1.676 m)   Wt 228 lb (103.4 kg)   LMP 12/02/2016   SpO2 97%   BMI 36.80 kg/m   Physical Exam  Constitutional: She appears well-developed.  HENT:  Head: Atraumatic.  Eyes: EOM are normal.  Neck: Neck supple.  Cardiovascular: Normal rate.   Pulmonary/Chest: Effort normal. She exhibits no tenderness.  Abdominal: There is no tenderness.  Musculoskeletal: She exhibits no edema or tenderness.  Neurological: She is alert.  Skin: Skin is warm. Capillary refill takes less than 2 seconds.     ED Treatments / Results  Labs (all labs ordered are listed, but only abnormal results are displayed) Labs Reviewed  BASIC METABOLIC PANEL - Abnormal; Notable for the following:       Result Value   Glucose, Bld 107 (*)    All other components within normal limits  CBC  I-STAT TROPOININ, ED    EKG  EKG Interpretation  Date/Time:  Tuesday December 16 2016 10:23:16 EDT Ventricular Rate:  84 PR Interval:    QRS Duration: 93 QT Interval:  363 QTC Calculation: 430 R Axis:   81 Text Interpretation:  Sinus rhythm Anteroseptal infarct, age indeterminate Baseline wander in lead(s) I II aVR aVF Confirmed by Alvino Chapel  MD, Brenlyn Beshara [phone]) on 12/16/2016 10:36:06 AM       Radiology Dg Chest 2 View  Result Date: 12/16/2016 CLINICAL DATA:  Chest tightness today EXAM: CHEST  2 VIEW COMPARISON:  None. FINDINGS: Normal heart size. Lungs clear. No pneumothorax. No pleural effusion. IMPRESSION: No active cardiopulmonary disease. Electronically Signed   By: Marybelle Killings M.D.   On: 12/16/2016 11:24    Procedures Procedures (including critical care time)  Medications Ordered in ED Medications - No data to display   Initial Impression / Assessment and Plan / ED Course  I have reviewed the triage vital signs and the nursing notes.  Pertinent labs & imaging results that were available during my care of the  patient were reviewed by me and considered in my medical decision making (see chart for details).     Patient with chest pain. Doubt cardiac cause. Doubt pulmonary wasn't. EKG x-ray and lab work reassuring. Will discharge home. Will follow-up with her PCP due to her strong family history.  Final Clinical Impressions(s) / ED Diagnoses   Final diagnoses:  Nonspecific chest pain    New Prescriptions New Prescriptions   OMEPRAZOLE (PRILOSEC) 20 MG CAPSULE    Take 1 capsule (20 mg total) by mouth daily.     Davonna Belling, MD 12/16/16 [phone]

## 2016-12-17 ENCOUNTER — Encounter (HOSPITAL_COMMUNITY): Payer: Self-pay | Admitting: Emergency Medicine

## 2016-12-17 ENCOUNTER — Emergency Department (HOSPITAL_COMMUNITY)
Admission: EM | Admit: 2016-12-17 | Discharge: 2016-12-18 | Disposition: A | Discharge status: Home / Self Care | Payer: BC Managed Care – PPO | Attending: Emergency Medicine | Admitting: Emergency Medicine

## 2016-12-17 DIAGNOSIS — R197 Diarrhea, unspecified: Secondary | ICD-10-CM | POA: Insufficient documentation

## 2016-12-17 DIAGNOSIS — R110 Nausea: Secondary | ICD-10-CM | POA: Insufficient documentation

## 2016-12-17 DIAGNOSIS — R079 Chest pain, unspecified: Secondary | ICD-10-CM

## 2016-12-17 DIAGNOSIS — R0789 Other chest pain: Secondary | ICD-10-CM | POA: Insufficient documentation

## 2016-12-17 DIAGNOSIS — R51 Headache: Secondary | ICD-10-CM | POA: Insufficient documentation

## 2016-12-17 LAB — CBC WITH DIFFERENTIAL/PLATELET
Basophils Absolute: 0 10*3/uL (ref 0.0–0.1)
Basophils Relative: 0 %
EOS ABS: 0.1 10*3/uL (ref 0.0–0.7)
Eosinophils Relative: 1 %
HEMATOCRIT: 40 % (ref 36.0–46.0)
HEMOGLOBIN: 13.9 g/dL (ref 12.0–15.0)
LYMPHS ABS: 2 10*3/uL (ref 0.7–4.0)
LYMPHS PCT: 15 %
MCH: 30.3 pg (ref 26.0–34.0)
MCHC: 34.8 g/dL (ref 30.0–36.0)
MCV: 87.3 fL (ref 78.0–100.0)
Monocytes Absolute: 0.6 10*3/uL (ref 0.1–1.0)
Monocytes Relative: 5 %
NEUTROS PCT: 79 %
Neutro Abs: 11 10*3/uL — ABNORMAL HIGH (ref 1.7–7.7)
Platelets: 299 10*3/uL (ref 150–400)
RBC: 4.58 MIL/uL (ref 3.87–5.11)
RDW: 12.7 % (ref 11.5–15.5)
WBC: 13.8 10*3/uL — ABNORMAL HIGH (ref 4.0–10.5)

## 2016-12-17 LAB — COMPREHENSIVE METABOLIC PANEL
ALK PHOS: 53 U/L (ref 38–126)
ALT: 13 U/L — AB (ref 14–54)
AST: 20 U/L (ref 15–41)
Albumin: 3.7 g/dL (ref 3.5–5.0)
Anion gap: 8 (ref 5–15)
BILIRUBIN TOTAL: 0.3 mg/dL (ref 0.3–1.2)
BUN: 9 mg/dL (ref 6–20)
CALCIUM: 9.1 mg/dL (ref 8.9–10.3)
CO2: 25 mmol/L (ref 22–32)
Chloride: 102 mmol/L (ref 101–111)
Creatinine, Ser: 0.82 mg/dL (ref 0.44–1.00)
Glucose, Bld: 102 mg/dL — ABNORMAL HIGH (ref 65–99)
Potassium: 4 mmol/L (ref 3.5–5.1)
Sodium: 135 mmol/L (ref 135–145)
TOTAL PROTEIN: 7.8 g/dL (ref 6.5–8.1)

## 2016-12-17 LAB — TROPONIN I: Troponin I: <0.03 ng/mL (ref <0.03)

## 2016-12-17 MED ORDER — KETOROLAC TROMETHAMINE 30 MG/ML IJ SOLN
15.0000 mg | Freq: Once | INTRAMUSCULAR | Status: AC
  Administered 2016-12-17: 15 mg via INTRAVENOUS
  Filled 2016-12-17: qty 1

## 2016-12-17 MED ORDER — IBUPROFEN 400 MG PO TABS: 400.0000 mg | ORAL_TABLET | Freq: Four times a day (QID) | ORAL | 0 refills | Status: AC

## 2016-12-17 MED ORDER — METOCLOPRAMIDE HCL 5 MG/ML IJ SOLN
10.0000 mg | Freq: Once | INTRAMUSCULAR | Status: AC
  Administered 2016-12-17: 10 mg via INTRAVENOUS
  Filled 2016-12-17: qty 2

## 2016-12-17 MED ORDER — DIPHENHYDRAMINE HCL 50 MG/ML IJ SOLN
25.0000 mg | Freq: Once | INTRAMUSCULAR | Status: AC
  Administered 2016-12-17: 25 mg via INTRAVENOUS
  Filled 2016-12-17: qty 1

## 2016-12-17 MED ORDER — METHYLPREDNISOLONE SODIUM SUCC 125 MG IJ SOLR
125.0000 mg | Freq: Once | INTRAMUSCULAR | Status: AC
  Administered 2016-12-17: 125 mg via INTRAVENOUS
  Filled 2016-12-17: qty 2

## 2016-12-17 MED ORDER — SODIUM CHLORIDE 0.9 % IV BOLUS (SEPSIS)
1000.0000 mL | Freq: Once | INTRAVENOUS | Status: AC
  Administered 2016-12-17: 1000 mL via INTRAVENOUS

## 2016-12-17 NOTE — ED Triage Notes (Signed)
CP that started yesterday, was seen here and d/c with non specific cp.  Pt states pain never really went away.  She also has a headache, diarrhea and has been nauseated today.

## 2016-12-17 NOTE — ED Provider Notes (Signed)
[facility] DEPT Provider Note   CSN: [id number] Arrival date & time: 12/17/16  2140  By signing my name below, I, Margit Banda, attest that this documentation has been prepared under the direction and in the presence of Merrily Pew, MD. Electronically Signed: Margit Banda, ED Scribe. 12/17/16. 10:54 PM.   History   Chief Complaint Chief Complaint  Patient presents with  . Chest Pain    HPI [name] is a 31 y.o. female who presents to the Emergency Department complaining of worsening tightness to her chest since yesterday. She was seen yesterday for similar sx. Pt reports pain radiates to her back. Associated sx include, HA, nausea and one episode of watery diarrhea PTA. She reports sitting up right doesn't cause as much discomfort as when she is laying down. No other modifying factors at this time. Pt took 2 Excedrin at noon for HA with mild relief. She reports no recent sicknesses, beside seasonal allergies. 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 Family h/o heart attacks. No PMHx. Changed BCP 5 months ago. Pt denies fever, cough, rash, SOB, leg swelling or any recent traumas.  The history is provided by the patient. No language interpreter was used.    Past Medical History:  Diagnosis Date  . Ankle fracture    hx of rt  . Arm fracture, left   . Panic attacks history of  . Pregnancy induced hypertension in third trimester, postpartum 08/26/2013  . S/P primary low transverse C-section 08/29/2013  . SVD (spontaneous vaginal delivery) 08/29/2013    Patient Active Problem List   Diagnosis Date Noted  . Postpartum care following cesarean delivery (08/28/13) 08/29/2013    Past Surgical History:  Procedure Laterality Date  . APPENDECTOMY    . CESAREAN SECTION N/A 08/28/2013   Procedure: CESAREAN SECTION;  Surgeon: Princess Bruins, MD;  Location: [facility];  Service: Obstetrics;   Laterality: N/A;  . CHOLECYSTECTOMY    . tendonitis surgery  2006  . TONSILLECTOMY      OB History    Gravida Para Term Preterm AB Living   1 1 1     1    SAB TAB Ectopic Multiple Live Births           1       Home Medications    Prior to Admission medications   Medication Sig Start Date End Date Taking? Authorizing Provider  acetaminophen (TYLENOL) 500 MG tablet Take 1,000 mg by mouth every 6 (six) hours as needed for headache.    Historical Provider, MD  cetirizine (ZYRTEC) 10 MG tablet Take 10 mg by mouth daily.    Historical Provider, MD  ferrous sulfate 325 (65 FE) MG tablet Take 1 tablet (325 mg total) by mouth 2 (two) times daily with a meal. Patient not taking: Reported on 09/05/2016 08/30/13   Gustavo Lah, NP  ibuprofen (ADVIL,MOTRIN) 600 MG tablet Take 1 tablet (600 mg total) by mouth every 6 (six) hours as needed. 08/30/13   Gustavo Lah, NP  Norethindrone Acetate-Ethinyl Estrad-FE (LOESTRIN 24 FE) 1-20 MG-MCG(24) tablet Take 1-20 tablets by mouth daily. 08/01/16   Historical Provider, MD  omeprazole (PRILOSEC) 20 MG capsule Take 1 capsule (20 mg total) by mouth daily. 12/16/16   Davonna Belling, MD  oxyCODONE-acetaminophen (PERCOCET/ROXICET) 5-325 MG per tablet Take 1-2 tablets by mouth every 4 (four) hours as needed for severe pain (moderate - severe pain).  Patient not taking: Reported on 09/05/2016 08/30/13   Gustavo Lah, NP  Prenatal Vit-Fe Fumarate-FA (PRENATAL MULTIVITAMIN) TABS Take 1 tablet by mouth daily.    Historical Provider, MD  senna-docusate (SENOKOT-S) 8.6-50 MG per tablet Take 2 tablets by mouth 2 (two) times daily as needed for mild constipation. Patient not taking: Reported on 09/05/2016 08/30/13   Gustavo Lah, NP    Family History Family History  Problem Relation Age of Onset  . Cancer Maternal Uncle     leukemia  . Cancer Maternal Grandfather     leukemia    Social History Social History  Substance Use Topics  . Smoking status: Never Smoker   . Smokeless tobacco: Never Used  . Alcohol use No     Allergies   Patient has no known allergies.   Review of Systems Review of Systems  Constitutional: Negative for fever.  Respiratory: Negative for cough and shortness of breath.   Cardiovascular: Positive for chest pain.  Gastrointestinal: Positive for diarrhea (one episode, watery) and nausea. Negative for vomiting.  Musculoskeletal: Negative for joint swelling.  Skin: Negative for rash.  Neurological: Positive for headaches.  All other systems reviewed and are negative.    Physical Exam Updated Vital Signs BP 134/76 (BP Location: Left Arm)   Pulse 81   Temp 98 F (36.7 C) (Oral)   Resp 20   Ht 5\' 6"  (1.676 m)   Wt 228 lb (103.4 kg)   LMP 12/02/2016   SpO2 100%   BMI 36.80 kg/m   Physical Exam  Constitutional: She appears well-developed and well-nourished. No distress.  HENT:  Head: Normocephalic and atraumatic.  Eyes: Conjunctivae are normal.  Neck: Normal range of motion.  Cardiovascular: Normal rate.   Pulmonary/Chest: Effort normal.  Lungs clear.  Abdominal: She exhibits no distension. There is no tenderness.  Genitourinary: Guaiac stool: yesterday came in pain in chest. pain in neck in friday.  Musculoskeletal: Normal range of motion.  Good distal pulses. No leg swelling. Equal radial pulses.  Neurological: She is alert.  Skin: No rash noted. No pallor.  Psychiatric: She has a normal mood and affect. Her behavior is normal.  Nursing note and vitals reviewed.    ED Treatments / Results  DIAGNOSTIC STUDIES: Oxygen Saturation is 100% on RA, normal by my interpretation.   COORDINATION OF CARE: 10:32 PM-Discussed next steps with pt which includes Toradol for pain, HA cocktail and labs. Pt verbalized understanding and is agreeable with the plan.    Labs (all labs ordered are listed, but only abnormal results are displayed) Labs Reviewed - No data to display  EKG  EKG Interpretation None         Radiology Dg Chest 2 View  Result Date: 12/16/2016 CLINICAL DATA:  Chest tightness today EXAM: CHEST  2 VIEW COMPARISON:  None. FINDINGS: Normal heart size. Lungs clear. No pneumothorax. No pleural effusion. IMPRESSION: No active cardiopulmonary disease. Electronically Signed   By: Marybelle Killings M.D.   On: 12/16/2016 11:24    Procedures Procedures (including critical care time)  Medications Ordered in ED Medications - No data to display   Initial Impression / Assessment and Plan / ED Course  I have reviewed the triage vital signs and the nursing notes.  Pertinent labs & imaging results that were available during my care of the patient were reviewed by me and considered in my medical decision making (see chart for details).     Possibly pericadritis? Will treat for same. No real diarrhea,  just one loose stool tonight. Will allow to get better on it's own. Headache is similar to migraines she has had in past aside from the fact it is not improving, so will give HA cocktail. Doubt meningitis/SAH or other causes at this time.   Final Clinical Impressions(s) / ED Diagnoses   Final diagnoses:  None    New Prescriptions New Prescriptions   No medications on file   I personally performed the services described in this documentation, which was scribed in my presence. The recorded information has been reviewed and is accurate.    Merrily Pew, MD 12/18/16 2236

## 2016-12-22 ENCOUNTER — Encounter: Payer: Self-pay | Admitting: Physician Assistant

## 2016-12-22 ENCOUNTER — Ambulatory Visit (INDEPENDENT_AMBULATORY_CARE_PROVIDER_SITE_OTHER): Payer: BC Managed Care – PPO | Admitting: Physician Assistant

## 2016-12-22 VITALS — BP 122/88 | HR 89 | Temp 98.0°F | Resp 18 | Wt 236.6 lb

## 2016-12-22 DIAGNOSIS — R079 Chest pain, unspecified: Secondary | ICD-10-CM

## 2016-12-22 NOTE — Progress Notes (Signed)
Patient ID: [name] MRN: [id number], DOB: Apr 30, 1986, 31 y.o. Date of Encounter: 12/22/2016, 4:54 PM    Chief Complaint:  Chief Complaint  Patient presents with  . chest pain f/u     HPI: 31 y.o. year old female presents for above.   Here to follow-up after recent ER visit. I reviewed her chart and she actually had an ER visit on 12/16/16 and then another ER visit the next day on 12/17/16.  At ER visit 12/16/16 she reported chest pain. She had taken some Prilosec which seemed to help some. However continued to have some of the pain. Troponin was negative. Other labs were normal. EKG showed sinus rhythm. Chest x-ray showed no acute disease. They felt chest pain was non-cardiac. They prescribed omeprazole 20 mg daily.  She returned to the ER and had another visit at the ED 12/17/16. At that visit they gave Toradol for pain and headache cocktail.  Today she states that she came for visit partly because the ER had told her to follow-up here, but also says that "taking ibuprofen like they told her, but now last night she is hurting in both sides of her low back, she then points to both sides of her low abdomen and across both sides of her upper abdomen and says that those areas of also been hurting then says that her neck has also been hurting and says that she has had some sinus headache. Says that her birth control pill was switched to a different birth control 5 months ago and is wondering if that's affecting all of these symptoms.     Home Meds:   Outpatient Medications Prior to Visit  Medication Sig Dispense Refill  . aspirin-acetaminophen-caffeine (EXCEDRIN MIGRAINE) 250-250-65 MG tablet Take 2 tablets by mouth every 6 (six) hours as needed for headache.    . cetirizine (ZYRTEC) 10 MG tablet Take 10 mg by mouth daily.    Marland Kitchen ibuprofen (ADVIL,MOTRIN) 400 MG tablet Take 1 tablet (400 mg total) by mouth 4 (four) times daily. 40 tablet 0  . Norethindrone Acetate-Ethinyl Estrad-FE  (LOMEDIA 24 FE) 1-20 MG-MCG(24) tablet Take 1 tablet by mouth daily.    Marland Kitchen omeprazole (PRILOSEC) 20 MG capsule Take 1 capsule (20 mg total) by mouth daily. 14 capsule 0  . Pyridoxine HCl (B-6 PO) Take 1 tablet by mouth daily.     No facility-administered medications prior to visit.     Allergies: No Known Allergies    Review of Systems: See HPI for pertinent ROS. All other ROS negative.    Physical Exam: Blood pressure 122/88, pulse 89, temperature 98 F (36.7 C), temperature source Oral, resp. rate 18, weight 236 lb 9.6 oz (107.3 kg), last menstrual period 11/29/2016, SpO2 98 %., Body mass index is 38.19 kg/m. General:  Obese WF. Appears in no acute distress. Neck: Supple. No thyromegaly. No lymphadenopathy. Lungs: Clear bilaterally to auscultation without wheezes, rales, or rhonchi. Breathing is unlabored. Heart: Regular rhythm. No murmurs, rubs, or gallops. Abdomen: Soft, non-tender, non-distended with normoactive bowel sounds. No hepatomegaly. No rebound/guarding. No obvious abdominal masses. No tenderness with palpation of areas she pointed to in upper abdomen or lower abdomen. Msk:  Strength and tone normal for age. Extremities/Skin: Warm and dry. Neuro: Alert and oriented X 3. Moves all extremities spontaneously. Gait is normal. CNII-XII grossly in tact. Psych:  Responds to questions appropriately with a normal affect.     ASSESSMENT AND PLAN:  31 y.o. year old female with  1.  Chest pain, unspecified type Told her to cont the iboprofen for several weeks--just make sure to take with food----and take omeprazole daily for couple of months then try coming off it.  Discussed that her low back pain is secondary to muscle strain. Reassured her that none of these symptoms seem to be related to the change in her birth control pill.   [address] [place], [place], [facility] 12/22/2016 4:54 PM

## 2017-01-23 ENCOUNTER — Ambulatory Visit (INDEPENDENT_AMBULATORY_CARE_PROVIDER_SITE_OTHER): Payer: BC Managed Care – PPO | Admitting: Obstetrics & Gynecology

## 2017-01-23 ENCOUNTER — Encounter: Payer: Self-pay | Admitting: Obstetrics & Gynecology

## 2017-01-23 VITALS — BP 138/90 | Ht 65.0 in | Wt 238.0 lb

## 2017-01-23 DIAGNOSIS — Z308 Encounter for other contraceptive management: Secondary | ICD-10-CM

## 2017-01-23 DIAGNOSIS — Z01419 Encounter for gynecological examination (general) (routine) without abnormal findings: Secondary | ICD-10-CM | POA: Diagnosis not present

## 2017-01-23 DIAGNOSIS — Z1151 Encounter for screening for human papillomavirus (HPV): Secondary | ICD-10-CM | POA: Diagnosis not present

## 2017-01-23 LAB — URINALYSIS W MICROSCOPIC + REFLEX CULTURE
Bilirubin Urine: NEGATIVE
CASTS: NONE SEEN [LPF]
Crystals: NONE SEEN [HPF]
Glucose, UA: NEGATIVE
Hgb urine dipstick: NEGATIVE
Ketones, ur: NEGATIVE
NITRITE: NEGATIVE
PH: 6.5 (ref 5.0–8.0)
Protein, ur: NEGATIVE
RBC / HPF: NONE SEEN RBC/HPF (ref <=2)
SPECIFIC GRAVITY, URINE: 1.01 (ref 1.001–1.035)

## 2017-01-23 NOTE — Progress Notes (Signed)
[name] 1986/02/16 [id number]   History:    31 y.o. Married.  G1P1 Son is 74 1/2 yo, doing well.  Established patient presenting for Gyn exam.  Was on Lomedia 24, but d/ced x 2 wks because of side effects including palpitations and anxiety.  Cardiopulmonary investigation neg.  Much better since stopped BCPs.  Using condoms, but would like to discuss alternative contraception.  No pelvic pain.  Breasts wnl.  Past medical history,surgical history, family history and social history were all reviewed and documented in the EPIC chart.  Gynecologic History Patient's last menstrual period was 01/09/2017. Contraception: condoms Last Pap: Normal per patient, will obtain Med records Last mammogram: None  Obstetric History OB History  Gravida Para Term Preterm AB Living  1 1 1     1   SAB TAB Ectopic Multiple Live Births          1    # Outcome Date GA Lbr Len/2nd Weight Sex Delivery Anes PTL Lv  1 Term 08/28/13 [redacted]w[redacted]d 11:24 / 02:20  M CS-LTranv EPI  LIV       ROS: A ROS was performed and pertinent positives and negatives are included in the history.  GENERAL: No fevers or chills. HEENT: No change in vision, no earache, sore throat or sinus congestion. NECK: No pain or stiffness. CARDIOVASCULAR: No chest pain or pressure. No palpitations. PULMONARY: No shortness of breath, cough or wheeze. GASTROINTESTINAL: No abdominal pain, nausea, vomiting or diarrhea, melena or bright red blood per rectum. GENITOURINARY: No urinary frequency, urgency, hesitancy or dysuria. MUSCULOSKELETAL: No joint or muscle pain, no back pain, no recent trauma. DERMATOLOGIC: No rash, no itching, no lesions. ENDOCRINE: No polyuria, polydipsia, no heat or cold intolerance. No recent change in weight. HEMATOLOGICAL: No anemia or easy bruising or bleeding. NEUROLOGIC: No headache, seizures, numbness, tingling or weakness. PSYCHIATRIC: No depression, no loss of interest in normal activity or change in sleep pattern.      Exam:   BP 138/90   Ht 5\' 5"  (1.651 m)   Wt 238 lb (108 kg)   LMP 01/09/2017   BMI 39.61 kg/m   Body mass index is 39.61 kg/m.  General appearance : Well developed well nourished female. No acute distress HEENT: Eyes: no retinal hemorrhage or exudates,  Neck supple, trachea midline, no carotid bruits, no thyroidmegaly Lungs: Clear to auscultation, no rhonchi or wheezes, or rib retractions  Heart: Regular rate and rhythm, no murmurs or gallops Breast:Examined in sitting and supine position were symmetrical in appearance, no palpable masses or tenderness,  no skin retraction, no nipple inversion, no nipple discharge, no skin discoloration, no axillary or supraclavicular lymphadenopathy Abdomen: no palpable masses or tenderness, no rebound or guarding Extremities: no edema or skin discoloration or tenderness  Pelvic:  Bartholin, Urethra, Skene Glands: Within normal limits             Vagina: No gross lesions or discharge  Cervix: No gross lesions or discharge.  Pap/HPV HR done.  Uterus  AV, normal size, shape and consistency, non-tender and mobile  Adnexa  Without masses or tenderness  Anus and perineum  normal     Assessment/Plan:  31 y.o. female for annual exam.  1. Encounter for routine gynecological examination with Papanicolaou smear of cervix Normal Gyn exam.  Pending Pap/HPV HR.    2. Encounter for other contraceptive management Side effects on BCPs, d/ced x 2 wks and feeling better.  Condoms.  Contraceptive counseling done.  Paragard or Kyleena IUD  discussed including Benefits/Risks/Insertion procedure.  Pamphlets given.  Will decide and call back for Insertion PRN.  Counseling >50% x 10 min on Contraception as above.   Princess Bruins MD, 4:17 PM 01/23/2017

## 2017-01-23 NOTE — Patient Instructions (Signed)
Your Annual/Gyn exam was normal today.  A pap/HPV HR was done and I will let you know the results as soon as available.  Contraception counseling.  You decided to use condoms for now.  Given that the [facility] was giving you side effects, IUDs were discussed.  The Paragard IUD is a copper IUD without hormones and Verdia Kuba is a Progestin IUD.  Info and pamphlets were provided.  Please call us back if you would like to schedule an IUD insertion. It was a pleasure to see you today!

## 2017-01-26 LAB — URINE CULTURE

## 2017-01-27 LAB — PAP, TP IMAGING W/ HPV RNA, RFLX HPV TYPE 16,18/45: HPV mRNA, High Risk: NOT DETECTED

## 2017-01-28 ENCOUNTER — Encounter: Payer: Self-pay | Admitting: Obstetrics & Gynecology

## 2017-02-16 ENCOUNTER — Encounter: Payer: Self-pay | Admitting: Physician Assistant

## 2017-02-16 ENCOUNTER — Ambulatory Visit (INDEPENDENT_AMBULATORY_CARE_PROVIDER_SITE_OTHER): Payer: BC Managed Care – PPO | Admitting: Physician Assistant

## 2017-02-16 VITALS — BP 140/110 | HR 91 | Temp 98.5°F | Resp 16 | Wt 244.8 lb

## 2017-02-16 DIAGNOSIS — F410 Panic disorder [episodic paroxysmal anxiety] without agoraphobia: Secondary | ICD-10-CM | POA: Diagnosis not present

## 2017-02-16 DIAGNOSIS — F411 Generalized anxiety disorder: Secondary | ICD-10-CM | POA: Diagnosis not present

## 2017-02-16 MED ORDER — VENLAFAXINE HCL ER 75 MG PO CP24: 75.0000 mg | ORAL_CAPSULE | Freq: Every day | ORAL | 1 refills | Status: DC

## 2017-02-16 NOTE — Progress Notes (Signed)
Patient ID: [name] MRN: [id number], DOB: August 31, 1986, 31 y.o. Date of Encounter: @DATE @  Chief Complaint:  Chief Complaint  Patient presents with  . Chest Pain    happens when she gets upset     HPI: 31 y.o. year old female  presents with above.   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  Also reviewed chart that she had an ED visit on 12/16/16 secondary to chest pain and another ER visit 12/17/16 with chest pain. At both of those visits chest pain was felt to be noncardiac. She was given omeprazole to treat possible GERD. She had follow-up office visit with me 12/22/16.  Today she reports that over this past weekend on Saturday and Sunday she had recurrent chest pain that was increased intensity. At that time her heart was also racing and heart rate was about 117. Says that she felt panic at that time. Says that she is having high stress at work with a lot of testing going on. Says that this weekend when she had the symptoms she was talking about work and thinks that is what is bringing on this panic.  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 States that the Effexor worked well for her and controlled her symptoms. States that these symptoms that she is having now are similar to the symptoms that she had back at that time when the Effexor was started.  Reports that she is having no increased stressors at home but mostly all related with work and thinks this is what is causing current symptoms.   Past Medical History:  Diagnosis Date  . Ankle fracture    hx of rt  . Arm fracture, left   . Panic attacks history of  . Pregnancy induced hypertension in third trimester, postpartum 08/26/2013  . S/P primary low transverse C-section 08/29/2013  . SVD (spontaneous vaginal delivery) 08/29/2013     Home  Meds: Outpatient Medications Prior to Visit  Medication Sig Dispense Refill  . cetirizine (ZYRTEC) 10 MG tablet Take 10 mg by mouth daily.    Marland Kitchen omeprazole (PRILOSEC) 20 MG capsule Take 1 capsule (20 mg total) by mouth daily. 14 capsule 0  . Norethindrone Acetate-Ethinyl Estrad-FE (LOMEDIA 24 FE) 1-20 MG-MCG(24) tablet Take 1 tablet by mouth daily.     No facility-administered medications prior to visit.     Allergies: No Known Allergies  Social History   Social History  . Marital status: Married    Spouse name: N/A  . Number of children: N/A  . Years of education: N/A   Occupational History  . Not on file.   Social History Main Topics  . Smoking status: Never Smoker  . Smokeless tobacco: Never Used  . Alcohol use No     Comment: occ  . Drug use: No  . Sexual activity: Yes    Partners: Male     Comment: 1st intercourse- 49, partners- 1   Other Topics Concern  . Not on file   Social History Narrative  . No narrative on file    Family History  Problem Relation Age of Onset  . Stroke Paternal Grandmother   . Cancer Maternal Uncle        leukemia  . Cancer Maternal Grandfather        leukemia  .  Heart attack Maternal Grandfather      Review of Systems:  See HPI for pertinent ROS. All other ROS negative.    Physical Exam: Blood pressure (!) 140/110, pulse 91, temperature 98.5 F (36.9 C), temperature source Oral, resp. rate 16, weight 244 lb 12.8 oz (111 kg), last menstrual period 02/02/2017, SpO2 98 %., Body mass index is 40.74 kg/m. General: Obese WF. Appears in no acute distress. Neck: Supple. No thyromegaly. No lymphadenopathy. Lungs: Clear bilaterally to auscultation without wheezes, rales, or rhonchi. Breathing is unlabored. Heart: RRR with S1 S2. No murmurs, rubs, or gallops. Musculoskeletal:  Strength and tone normal for age. Extremities/Skin: Warm and dry.  Neuro: Alert and oriented X 3. Moves all extremities spontaneously. Gait is normal. CNII-XII  grossly in tact. Psych:  Responds to questions appropriately with a normal affect.     ASSESSMENT AND PLAN:  31 y.o. year old female with  1. Generalized anxiety disorder with panic attacks Reminded her that it will take the Effexor time to start to work and be effective. If she thinks the medicine is causing adverse effects then call me. Otherwise just continue taking it until has follow-up visit with me at 6 weeks. She voices understanding and agrees. - venlafaxine XR (EFFEXOR XR) 75 MG 24 hr capsule; Take 1 capsule (75 mg total) by mouth daily with breakfast.  Dispense: 30 capsule; Refill: 1   Signed, [address] [place], [place], [facility] 02/16/2017 3:59 PM

## 2017-02-18 ENCOUNTER — Telehealth: Payer: Self-pay

## 2017-02-18 NOTE — Telephone Encounter (Signed)
Patient called to say her heart rate was up and she was worried because she was still experiencing chest pains. When I asked what her readings were she said it was over 100. I explained that as long as her pulse is not over 120 continuously then she was fine.    I explained to patient she need to take medication that was prescribed to her on 5/14 try and calm down. Patient was instructed to call back if her pulse readings was 120 or higher and staying high as well as having chest pains.Patient verbalized understanding.

## 2017-02-18 NOTE — Telephone Encounter (Signed)
Agree 

## 2017-03-17 ENCOUNTER — Other Ambulatory Visit: Payer: Self-pay | Admitting: Physician Assistant

## 2017-03-17 DIAGNOSIS — F411 Generalized anxiety disorder: Principal | ICD-10-CM

## 2017-03-17 DIAGNOSIS — F410 Panic disorder [episodic paroxysmal anxiety] without agoraphobia: Secondary | ICD-10-CM

## 2017-03-17 NOTE — Telephone Encounter (Signed)
Rx filled per protocol  

## 2017-03-30 ENCOUNTER — Ambulatory Visit (INDEPENDENT_AMBULATORY_CARE_PROVIDER_SITE_OTHER): Payer: BC Managed Care – PPO | Admitting: Physician Assistant

## 2017-03-30 ENCOUNTER — Encounter: Payer: Self-pay | Admitting: Physician Assistant

## 2017-03-30 DIAGNOSIS — F411 Generalized anxiety disorder: Secondary | ICD-10-CM | POA: Diagnosis not present

## 2017-03-30 DIAGNOSIS — K219 Gastro-esophageal reflux disease without esophagitis: Secondary | ICD-10-CM | POA: Diagnosis not present

## 2017-03-30 DIAGNOSIS — F410 Panic disorder [episodic paroxysmal anxiety] without agoraphobia: Secondary | ICD-10-CM | POA: Diagnosis not present

## 2017-03-30 MED ORDER — VENLAFAXINE HCL ER 75 MG PO CP24: ORAL_CAPSULE | ORAL | 1 refills | Status: DC

## 2017-03-30 MED ORDER — RANITIDINE HCL 150 MG PO TABS: 150.0000 mg | ORAL_TABLET | Freq: Every day | ORAL | 2 refills | Status: DC

## 2017-03-30 NOTE — Progress Notes (Signed)
Patient ID: [name] MRN: [id number], DOB: [birth year], 31 y.o. Date of Encounter: @DATE @  Chief Complaint:  Chief Complaint  Patient presents with  . anxiety f/u    HPI: 31 y.o. year old female  presents with above.   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  Also reviewed chart that she had an ED visit on 12/16/16 secondary to chest pain and another ER visit 12/17/16 with chest pain. At both of those visits chest pain was felt to be noncardiac. She was given omeprazole to treat possible GERD. She had follow-up office visit with me 12/22/16.  Today she reports that over this past weekend on Saturday and Sunday she had recurrent chest pain that was increased intensity. At that time her heart was also racing and heart rate was about 117. Says that she felt panic at that time. Says that she is having high stress at work with a lot of testing going on. Says that this weekend when she had the symptoms she was talking about work and thinks that is what is bringing on this panic.  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 States that the Effexor worked well for her and controlled her symptoms. States that these symptoms that she is having now are similar to the symptoms that she had back at that time when the Effexor was started.  Reports that she is having no increased stressors at home but mostly all related with work and thinks this is what is causing current symptoms. A/P at [place] 02/16/2017: 1. Generalized anxiety disorder with panic attacks Reminded her that it will take the Effexor time to start to work and be effective. If she thinks the medicine is causing adverse effects then call me. Otherwise just continue taking it until has follow-up visit with me at 6 weeks. She voices understanding and agrees. -  venlafaxine XR (EFFEXOR XR) 75 MG 24 hr capsule; Take 1 capsule (75 mg total) by mouth daily with breakfast.  Dispense: 30 capsule; Refill: 1   03/30/2017: Today she states that thee Effexor has helped phenomenally. States that this is controlling her anxiety. States that she is also sleeping better and is not waking up at night worrying about things. States that once the Effexor got her anxiety controlled she was able to tell that she was also having some chest pain after eating spicy foods and certain foods. Is taking Zantac daily. This completely control those symptoms. Having no residual symptoms as long as she takes the Zantac. Discussed that she could try taking the Zantac just as needed when she knows that she is going to be eating spicy foods. Says that actually she was having some symptoms after eating some other foods as well so will continue taking it daily for now and then try to adjust and decrease use. Also that her anxiety is controlled very well on this dose of Effexor and does not feel that she needs increased dose. Is having no adverse effects.    Past Medical History:  Diagnosis Date  . Ankle fracture    hx of rt  . Arm fracture, left   . Panic attacks history of  . Pregnancy induced hypertension in third trimester, postpartum 08/26/2013  . S/P primary low transverse C-section 08/29/2013  . SVD (spontaneous vaginal  delivery) 08/29/2013     Home Meds: Outpatient Medications Prior to Visit  Medication Sig Dispense Refill  . cetirizine (ZYRTEC) 10 MG tablet Take 10 mg by mouth daily.    Marland Kitchen omeprazole (PRILOSEC) 20 MG capsule Take 1 capsule (20 mg total) by mouth daily. 14 capsule 0  . venlafaxine XR (EFFEXOR-XR) 75 MG 24 hr capsule TAKE (1) CAPSULE BY MOUTH EACH MORNING. 30 capsule 0   No facility-administered medications prior to visit.     Allergies: No Known Allergies  Social History   Social History  . Marital status: Married    Spouse name: N/A  . Number of  children: N/A  . Years of education: N/A   Occupational History  . Not on file.   Social History Main Topics  . Smoking status: Never Smoker  . Smokeless tobacco: Never Used  . Alcohol use No     Comment: occ  . Drug use: No  . Sexual activity: Yes    Partners: Male     Comment: 1st intercourse- 74, partners- 1   Other Topics Concern  . Not on file   Social History Narrative  . No narrative on file    Family History  Problem Relation Age of Onset  . Stroke Paternal Grandmother   . Cancer Maternal Uncle        leukemia  . Cancer Maternal Grandfather        leukemia  . Heart attack Maternal Grandfather      Review of Systems:  See HPI for pertinent ROS. All other ROS negative.    Physical Exam: Blood pressure 126/90, pulse 98, temperature 97.8 F (36.6 C), temperature source Oral, resp. rate 16, height 5\' 6"  (1.676 m), weight 242 lb 12.8 oz (110.1 kg), last menstrual period 02/27/2017, SpO2 98 %., Body mass index is 39.19 kg/m. General: Obese WF. Appears in no acute distress. Neck: Supple. No thyromegaly. No lymphadenopathy. Lungs: Clear bilaterally to auscultation without wheezes, rales, or rhonchi. Breathing is unlabored. Heart: RRR with S1 S2. No murmurs, rubs, or gallops. Musculoskeletal:  Strength and tone normal for age. Extremities/Skin: Warm and dry.  Neuro: Alert and oriented X 3. Moves all extremities spontaneously. Gait is normal. CNII-XII grossly in tact. Psych:  Responds to questions appropriately with a normal affect.     ASSESSMENT AND PLAN:  31 y.o. year old female with   1. Generalized anxiety disorder with panic attacks This is now stable/controlled. Continue current dose of Effexor. Follow-up visit 6 months or sooner if needed. - venlafaxine XR (EFFEXOR-XR) 75 MG 24 hr capsule; TAKE (1) CAPSULE BY MOUTH EACH MORNING.  Dispense: 90 capsule; Refill: 1  2. Gastroesophageal reflux disease, esophagitis presence not specified Continue the Zantac  daily for a while but then try to decrease to PRN - ranitidine (ZANTAC) 150 MG tablet; Take 1 tablet (150 mg total) by mouth daily.  Dispense: 90 tablet; Refill: 2  Follow-up office visit 6 months or sooner if needed.   Signed, [address] [place], [place], [facility] 03/30/2017 8:20 AM

## 2017-07-18 ENCOUNTER — Other Ambulatory Visit: Payer: Self-pay | Admitting: Physician Assistant

## 2017-07-18 DIAGNOSIS — F410 Panic disorder [episodic paroxysmal anxiety] without agoraphobia: Secondary | ICD-10-CM

## 2017-07-18 DIAGNOSIS — F411 Generalized anxiety disorder: Principal | ICD-10-CM

## 2017-10-01 ENCOUNTER — Ambulatory Visit: Payer: BC Managed Care – PPO | Admitting: Physician Assistant

## 2017-10-01 ENCOUNTER — Encounter: Payer: Self-pay | Admitting: Physician Assistant

## 2017-10-01 ENCOUNTER — Other Ambulatory Visit: Payer: Self-pay

## 2017-10-01 VITALS — BP 130/82 | HR 95 | Temp 98.1°F | Resp 16 | Ht 66.0 in | Wt 268.2 lb

## 2017-10-01 DIAGNOSIS — F410 Panic disorder [episodic paroxysmal anxiety] without agoraphobia: Secondary | ICD-10-CM

## 2017-10-01 DIAGNOSIS — K219 Gastro-esophageal reflux disease without esophagitis: Secondary | ICD-10-CM

## 2017-10-01 DIAGNOSIS — F411 Generalized anxiety disorder: Secondary | ICD-10-CM

## 2017-10-01 MED ORDER — VENLAFAXINE HCL ER 75 MG PO CP24: ORAL_CAPSULE | ORAL | 2 refills | Status: DC

## 2017-10-01 NOTE — Progress Notes (Signed)
Patient ID: [name] MRN: [id number], DOB: 1986/06/17, 31 y.o. Date of Encounter: @DATE @  Chief Complaint:  Chief Complaint  Patient presents with  . 6 month follow up    HPI: 31 y.o. year old female  presents with above.   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  Also reviewed chart that she had an ED visit on 12/16/16 secondary to chest pain and another ER visit 12/17/16 with chest pain. At both of those visits chest pain was felt to be noncardiac. She was given omeprazole to treat possible GERD. She had follow-up office visit with me 12/22/16.  Today she reports that over this past weekend on Saturday and Sunday she had recurrent chest pain that was increased intensity. At that time her heart was also racing and heart rate was about 117. Says that she felt panic at that time. Says that she is having high stress at work with a lot of testing going on. Says that this weekend when she had the symptoms she was talking about work and thinks that is what is bringing on this panic.  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 States that the Effexor worked well for her and controlled her symptoms. States that these symptoms that she is having now are similar to the symptoms that she had back at that time when the Effexor was started.  Reports that she is having no increased stressors at home but mostly all related with work and thinks this is what is causing current symptoms. A/P at [place] 02/16/2017: 1. Generalized anxiety disorder with panic attacks Reminded her that it will take the Effexor time to start to work and be effective. If she thinks the medicine is causing adverse effects then call me. Otherwise just continue taking it until has follow-up visit with me at 6 weeks. She voices understanding and  agrees. - venlafaxine XR (EFFEXOR XR) 75 MG 24 hr capsule; Take 1 capsule (75 mg total) by mouth daily with breakfast.  Dispense: 30 capsule; Refill: 1   03/30/2017: Today she states that the Effexor has helped phenomenally. States that this is controlling her anxiety. States that she is also sleeping better and is not waking up at night worrying about things. States that once the Effexor got her anxiety controlled she was able to tell that she was also having some chest pain after eating spicy foods and certain foods. Is taking Zantac daily. This completely control those symptoms. Having no residual symptoms as long as she takes the Zantac. Discussed that she could try taking the Zantac just as needed when she knows that she is going to be eating spicy foods. Says that actually she was having some symptoms after eating some other foods as well so will continue taking it daily for now and then try to adjust and decrease use. Also that her anxiety is controlled very well on this dose of Effexor and does not feel that she needs increased dose. Is having no adverse effects.   10/01/2017: She reports that the Effexor continues to work well.  States that this is controlling her anxiety.  Says that everything seems very well controlled with using this medication and is causing no adverse effects. States that she only uses Zantac as needed and rarely needs this. She has  no other concerns to address today. She teaches seventh grade.    Past Medical History:  Diagnosis Date  . Ankle fracture    hx of rt  . Arm fracture, left   . Panic attacks history of  . Pregnancy induced hypertension in third trimester, postpartum 08/26/2013  . S/P primary low transverse C-section 08/29/2013  . SVD (spontaneous vaginal delivery) 08/29/2013     Home Meds: Outpatient Medications Prior to Visit  Medication Sig Dispense Refill  . cetirizine (ZYRTEC) 10 MG tablet Take 10 mg by mouth daily.    . Prenatal Vit-Fe  Fumarate-FA (PRENATAL VITAMIN PO) Take by mouth.    . ranitidine (ZANTAC) 150 MG tablet Take 1 tablet (150 mg total) by mouth daily. 90 tablet 2  . venlafaxine XR (EFFEXOR-XR) 75 MG 24 hr capsule TAKE (1) CAPSULE BY MOUTH EACH MORNING. 90 capsule 0  . omeprazole (PRILOSEC) 20 MG capsule Take 1 capsule (20 mg total) by mouth daily. 14 capsule 0   No facility-administered medications prior to visit.     Allergies: No Known Allergies  Social History   Socioeconomic History  . Marital status: Married    Spouse name: Not on file  . Number of children: Not on file  . Years of education: Not on file  . Highest education level: Not on file  Social Needs  . Financial resource strain: Not on file  . Food insecurity - worry: Not on file  . Food insecurity - inability: Not on file  . Transportation needs - medical: Not on file  . Transportation needs - non-medical: Not on file  Occupational History  . Not on file  Tobacco Use  . Smoking status: Never Smoker  . Smokeless tobacco: Never Used  Substance and Sexual Activity  . Alcohol use: No    Comment: occ  . Drug use: No  . Sexual activity: Yes    Partners: Male    Comment: 1st intercourse- 74, partners- 1  Other Topics Concern  . Not on file  Social History Narrative  . Not on file    Family History  Problem Relation Age of Onset  . Stroke Paternal Grandmother   . Cancer Maternal Uncle        leukemia  . Cancer Maternal Grandfather        leukemia  . Heart attack Maternal Grandfather      Review of Systems:  See HPI for pertinent ROS. All other ROS negative.    Physical Exam: Blood pressure 130/82, pulse 95, temperature 98.1 F (36.7 C), temperature source Oral, resp. rate 16, height 5\' 6"  (1.676 m), weight 121.7 kg (268 lb 3.2 oz), last menstrual period 07/17/2017, SpO2 97 %., There is no height or weight on file to calculate BMI. General: Obese WF. Appears in no acute distress. Neck: Supple. No thyromegaly. No  lymphadenopathy. Lungs: Clear bilaterally to auscultation without wheezes, rales, or rhonchi. Breathing is unlabored. Heart: RRR with S1 S2. No murmurs, rubs, or gallops. Musculoskeletal:  Strength and tone normal for age. Extremities/Skin: Warm and dry.  Neuro: Alert and oriented X 3. Moves all extremities spontaneously. Gait is normal. CNII-XII grossly in tact. Psych:  Responds to questions appropriately with a normal affect.     ASSESSMENT AND PLAN:  31 y.o. year old female with   1. Generalized anxiety disorder with panic attacks This is now stable/controlled. Continue current dose of Effexor. Follow-up visit 1 year or sooner if needed. - venlafaxine XR (EFFEXOR-XR) 75 MG 24 hr  capsule; TAKE (1) CAPSULE BY MOUTH EACH MORNING.  Dispense: 90 capsule; Refill: 2  2. Gastroesophageal reflux disease, esophagitis presence not specified Continue the Zantac  PRN - ranitidine (ZANTAC) 150 MG tablet; Take 1 tablet (150 mg total) by mouth daily.  Dispense: 90 tablet; Refill: 2  Follow-up office visit 1 year or sooner if needed.   Signed, [address] [place], [place], [facility] 10/01/2017 8:08 AM

## 2017-10-26 ENCOUNTER — Encounter: Payer: Self-pay | Admitting: Physician Assistant

## 2017-10-26 ENCOUNTER — Other Ambulatory Visit: Payer: Self-pay

## 2017-10-26 ENCOUNTER — Ambulatory Visit: Payer: BC Managed Care – PPO | Admitting: Physician Assistant

## 2017-10-26 VITALS — BP 126/79 | HR 82 | Temp 97.9°F | Resp 16 | Ht 66.0 in | Wt 269.2 lb

## 2017-10-26 DIAGNOSIS — J309 Allergic rhinitis, unspecified: Secondary | ICD-10-CM

## 2017-10-26 DIAGNOSIS — H6123 Impacted cerumen, bilateral: Secondary | ICD-10-CM

## 2017-10-26 DIAGNOSIS — H9202 Otalgia, left ear: Secondary | ICD-10-CM

## 2017-10-26 NOTE — Progress Notes (Signed)
Patient ID: [name] MRN: [id number], DOB: 25-Jun-1986, 33 y.o. Date of Encounter: 10/26/2017, 8:51 AM    Chief Complaint:  Chief Complaint  Patient presents with  . left ear pain    x1week      HPI: 32 y.o. year old female presents with above.   Says that "left ear has felt like there is fluid in it or something--for about a week"  Says that she has problems with her allergies all the time---takes Zyrtec daily---has only been getting clear drainage from her nose--like what she usually has with her allergies---has had no thick, dark mucus. Has had no sore throat, no chest congestion, no cough. No fever.     Home Meds:   Outpatient Medications Prior to Visit  Medication Sig Dispense Refill  . cetirizine (ZYRTEC) 10 MG tablet Take 10 mg by mouth daily.    . Prenatal Vit-Fe Fumarate-FA (PRENATAL VITAMIN PO) Take by mouth.    . ranitidine (ZANTAC) 150 MG tablet Take 1 tablet (150 mg total) by mouth daily. 90 tablet 2  . venlafaxine XR (EFFEXOR-XR) 75 MG 24 hr capsule TAKE (1) CAPSULE BY MOUTH EACH MORNING. 90 capsule 2   No facility-administered medications prior to visit.     Allergies: No Known Allergies    Review of Systems: See HPI for pertinent ROS. All other ROS negative.    Physical Exam: Blood pressure 126/79, pulse 82, temperature 97.9 F (36.6 C), temperature source Oral, resp. rate 16, height 5\' 6"  (1.676 m), weight 122.1 kg (269 lb 3.2 oz), last menstrual period 07/26/2017, SpO2 98 %., Body mass index is 43.45 kg/m. General:  WF. Appears in no acute distress. HEENT: Normocephalic, atraumatic, eyes without discharge, sclera non-icteric, nares are without discharge. Right ear canal 100% obstructed with cerumen. Left ear canal 90 % obstructed with cerumen. There is tiny patent area along side of canal--reminder of canal is obstructed. Neck: Supple. No thyromegaly. No lymphadenopathy. Lungs: Clear bilaterally to auscultation without wheezes, rales, or  rhonchi. Breathing is unlabored. Heart: Regular rhythm. No murmurs, rubs, or gallops. Msk:  Strength and tone normal for age. Extremities/Skin: Warm and dry.  Neuro: Alert and oriented X 3. Moves all extremities spontaneously. Gait is normal. CNII-XII grossly in tact. Psych:  Responds to questions appropriately with a normal affect.     ASSESSMENT AND PLAN:  33 y.o. year old female with  1. Left ear pain Suspect that cerumen is the cause of her symptoms. She does not have time to stay to have irrigated here in office today so she prefers to use drops at home.  Also gave samples of Norel AD to use as decongestant in case congestion contributing to symptoms.  F/U if symptoms do not resolve after uisng above treatments.  2. Bilateral impacted cerumen Suspect that cerumen is the cause of her symptoms. She does not have time to stay to have irrigated here in office today so she prefers to use drops at home.  Also gave samples of Norel AD to use as decongestant in case congestion contributing to symptoms.  F/U if symptoms do not resolve after uisng above treatments.   3. Allergic rhinitis, unspecified seasonality, unspecified trigger Suspect that cerumen is the cause of her symptoms. She does not have time to stay to have irrigated here in office today so she prefers to use drops at home.  Also gave samples of Norel AD to use as decongestant in case congestion contributing to symptoms.  F/U if symptoms do  not resolve after uisng above treatments.    Signed, [address] [place], [place], [facility] 10/26/2017 8:51 AM

## 2017-11-19 ENCOUNTER — Encounter: Payer: Self-pay | Admitting: Family Medicine

## 2017-11-19 ENCOUNTER — Ambulatory Visit: Payer: BC Managed Care – PPO | Admitting: Family Medicine

## 2017-11-19 VITALS — BP 138/92 | HR 94 | Temp 98.3°F | Resp 16 | Ht 66.0 in | Wt 270.0 lb

## 2017-11-19 DIAGNOSIS — H9202 Otalgia, left ear: Secondary | ICD-10-CM

## 2017-11-19 MED ORDER — CYCLOBENZAPRINE HCL 10 MG PO TABS: 10.0000 mg | ORAL_TABLET | Freq: Three times a day (TID) | ORAL | 0 refills | Status: DC | PRN

## 2017-11-19 NOTE — Progress Notes (Signed)
Subjective:    Patient ID: [name], female    DOB: 07-02-1986, 32 y.o.   MRN: [id number]  HPI Patient was recently seen at urgent care for left-sided otalgia and was given Sudafed and prednisone for eustachian tube dysfunction.  After 3 or 4 days on the prednisone, the pain in her left ear resolved.  She was fine until this week when she developed an upper respiratory infection with rhinorrhea, head congestion, and cough.  At that exact same time, the pain in her "left ear" returned.  However on exam today the pain is not actually in the ear.  The pain is located inferior to the ear near the angle of the mandible in the sternocleidomastoid muscle just below the tragus.  I can reproduce the pain by palpation in this area.  There is no palpable deformity or lymphadenopathy in this area.  The pain is made better by the patient turning her head to the side and relieving the muscle tension.  She denies any vertigo.  She denies any hearing loss.  She denies any sinus pain or fever.  On examination, the left tympanic membrane appears normal.  There is no obvious middle ear effusion. Past Medical History:  Diagnosis Date  . Ankle fracture    hx of rt  . Arm fracture, left   . Panic attacks history of  . Pregnancy induced hypertension in third trimester, postpartum 08/26/2013  . S/P primary low transverse C-section 08/29/2013  . SVD (spontaneous vaginal delivery) 08/29/2013   Past Surgical History:  Procedure Laterality Date  . APPENDECTOMY    . CESAREAN SECTION N/A 08/28/2013   Procedure: CESAREAN SECTION;  Surgeon: Princess Bruins, MD;  Location: [facility];  Service: Obstetrics;  Laterality: N/A;  . CHOLECYSTECTOMY    . tendonitis surgery  2006  . TONSILLECTOMY     Current Outpatient Medications on File Prior to Visit  Medication Sig Dispense Refill  . cetirizine (ZYRTEC) 10 MG tablet Take 10 mg by mouth daily.    . Prenatal Vit-Fe Fumarate-FA (PRENATAL VITAMIN PO) Take by mouth.    .  ranitidine (ZANTAC) 150 MG tablet Take 1 tablet (150 mg total) by mouth daily. 90 tablet 2  . venlafaxine XR (EFFEXOR-XR) 75 MG 24 hr capsule TAKE (1) CAPSULE BY MOUTH EACH MORNING. 90 capsule 2   No current facility-administered medications on file prior to visit.    No Known Allergies Social History   Socioeconomic History  . Marital status: Married    Spouse name: Not on file  . Number of children: Not on file  . Years of education: Not on file  . Highest education level: Not on file  Social Needs  . Financial resource strain: Not on file  . Food insecurity - worry: Not on file  . Food insecurity - inability: Not on file  . Transportation needs - medical: Not on file  . Transportation needs - non-medical: Not on file  Occupational History  . Not on file  Tobacco Use  . Smoking status: Never Smoker  . Smokeless tobacco: Never Used  Substance and Sexual Activity  . Alcohol use: No    Comment: occ  . Drug use: No  . Sexual activity: Yes    Partners: Male    Comment: 1st intercourse- 25, partners- 1  Other Topics Concern  . Not on file  Social History Narrative  . Not on file      Review of Systems  All other systems reviewed and are  negative.      Objective:   Physical Exam  Constitutional: She appears well-developed and well-nourished. No distress.  HENT:  Right Ear: External ear normal.  Left Ear: Tympanic membrane and external ear normal. No tenderness. No mastoid tenderness. Tympanic membrane is not injected, not scarred, not perforated, not erythematous, not retracted and not bulging.  No middle ear effusion.  Nose: Nose normal.  Mouth/Throat: Oropharynx is clear and moist. No oropharyngeal exudate.  Eyes: Conjunctivae are normal.  Neck: Neck supple. Carotid bruit is not present.    Cardiovascular: Normal rate, regular rhythm and normal heart sounds.  Pulmonary/Chest: Effort normal and breath sounds normal. No respiratory distress. She has no wheezes.  She has no rales.  Lymphadenopathy:    She has no cervical adenopathy.  Skin: She is not diaphoretic.  Vitals reviewed.         Assessment & Plan:  Otalgia, left  I have drawn on her diagram the location of the pain is a red circle just below the tragus of the left ear.  This seems to be muscular.  Examination of the left tympanic membrane shows no middle ear effusion.  I cannot rule out eustachian tube dysfunction as the patient's symptoms did begin with the start of an upper respiratory infection.  However I will treat the patient as muscle pain with Flexeril 5-10 mg every 8 hours as needed in addition to ibuprofen over-the-counter 600 mg every 8 hours and recheck on Monday.  If the pain is better it was most likely muscular.  If the pain is no better and she continues to have pain that she attributes to the left ear, I will consult ENT for possible eustachian tube dysfunction.

## 2017-11-24 ENCOUNTER — Telehealth: Payer: Self-pay | Admitting: Family Medicine

## 2017-11-24 DIAGNOSIS — H9313 Tinnitus, bilateral: Secondary | ICD-10-CM

## 2017-11-24 DIAGNOSIS — H9202 Otalgia, left ear: Secondary | ICD-10-CM

## 2017-11-24 DIAGNOSIS — H9201 Otalgia, right ear: Secondary | ICD-10-CM

## 2017-11-24 NOTE — Telephone Encounter (Signed)
Ear was normal appearing when I saw her.  Would recommend ENT consult if still hurting or could see MBD tomorrow to see if ear is infected.

## 2017-11-24 NOTE — Telephone Encounter (Signed)
Pt called and states that her ear pain in no better after treatment.

## 2017-11-25 NOTE — Telephone Encounter (Signed)
Pt would like to see ENT and referral placed

## 2018-03-10 ENCOUNTER — Ambulatory Visit: Payer: BC Managed Care – PPO | Admitting: Family Medicine

## 2018-03-10 VITALS — BP 110/76 | HR 86 | Temp 98.2°F | Resp 16 | Ht 66.0 in | Wt 266.0 lb

## 2018-03-10 DIAGNOSIS — J0140 Acute pansinusitis, unspecified: Secondary | ICD-10-CM | POA: Diagnosis not present

## 2018-03-10 DIAGNOSIS — J40 Bronchitis, not specified as acute or chronic: Secondary | ICD-10-CM | POA: Diagnosis not present

## 2018-03-10 MED ORDER — PREDNISONE 20 MG PO TABS: 40.0000 mg | ORAL_TABLET | Freq: Every day | ORAL | 0 refills | Status: AC

## 2018-03-10 MED ORDER — ALBUTEROL SULFATE HFA 108 (90 BASE) MCG/ACT IN AERS: 2.0000 | INHALATION_SPRAY | RESPIRATORY_TRACT | 0 refills | Status: DC | PRN

## 2018-03-10 MED ORDER — GUAIFENESIN ER 600 MG PO TB12: 600.0000 mg | ORAL_TABLET | Freq: Two times a day (BID) | ORAL | 0 refills | Status: AC

## 2018-03-10 MED ORDER — IPRATROPIUM-ALBUTEROL 0.5-2.5 (3) MG/3ML IN SOLN
3.0000 mL | Freq: Once | RESPIRATORY_TRACT | Status: AC
  Administered 2018-03-10: 3 mL via RESPIRATORY_TRACT

## 2018-03-10 MED ORDER — BENZONATATE 200 MG PO CAPS: 200.0000 mg | ORAL_CAPSULE | Freq: Two times a day (BID) | ORAL | 0 refills | Status: DC | PRN

## 2018-03-10 NOTE — Progress Notes (Signed)
Patient ID: [name], female    DOB: [birth year], 32 y.o.   MRN: [id number]  PCP: Orlena Sheldon, PA-C  Chief Complaint  Patient presents with  . Chest congestion, cough, ST    Subjective:   [name] is a 32 y.o. female, presents to clinic with CC of 3 days of nasal congestion, sore throat with dry cough and mild associated fatigue.  Coughing causes central chest tightness and burning sensation that radiates to her back.  No improvement with Zyrtec, Flonase and Sudafed.  Patient denies any fever, hot or cold chills, sweats, body aches, rash.  No CP with breathing, at rest or with exertion, no SOB, weight loss, recent travel.     Patient Active Problem List   Diagnosis Date Noted  . GERD (gastroesophageal reflux disease) 03/30/2017  . Generalized anxiety disorder 03/30/2017  . Panic disorder 03/30/2017     Prior to Admission medications   Medication Sig Start Date End Date Taking? Authorizing Provider  cetirizine (ZYRTEC) 10 MG tablet Take 10 mg by mouth daily.   Yes [provider]  Prenatal Vit-Fe Fumarate-FA (PRENATAL VITAMIN PO) Take by mouth.   Yes [provider]  ranitidine (ZANTAC) 150 MG tablet Take 1 tablet (150 mg total) by mouth daily. 03/30/17  Yes Orlena Sheldon, PA-C  triamterene-hydrochlorothiazide (DYAZIDE) 37.5-25 MG capsule Take 1 capsule by mouth daily.  02/08/18  Yes [provider]  venlafaxine XR (EFFEXOR-XR) 75 MG 24 hr capsule TAKE (1) CAPSULE BY MOUTH EACH MORNING. 10/01/17  Yes Dena Billet B, PA-C  albuterol (PROVENTIL HFA;VENTOLIN HFA) 108 (90 Base) MCG/ACT inhaler Inhale 2 puffs into the lungs every 4 (four) hours as needed for wheezing or shortness of breath. 03/10/18   Delsa Grana, PA-C  benzonatate (TESSALON) 200 MG capsule Take 1 capsule (200 mg total) by mouth 2 (two) times daily as needed for cough. 03/10/18   Delsa Grana, PA-C  guaiFENesin (MUCINEX) 600 MG 12 hr tablet Take 1 tablet (600 mg total) by mouth 2  (two) times daily for 7 days. 03/10/18 03/17/18  Delsa Grana, PA-C  predniSONE (DELTASONE) 20 MG tablet Take 2 tablets (40 mg total) by mouth daily with breakfast for 5 days. 03/10/18 03/15/18  Delsa Grana, PA-C     No Known Allergies   Family History  Problem Relation Age of Onset  . Stroke Paternal Grandmother   . Cancer Maternal Uncle        leukemia  . Cancer Maternal Grandfather        leukemia  . Heart attack Maternal Grandfather      Social History   Socioeconomic History  . Marital status: Married    Spouse name: Not on file  . Number of children: Not on file  . Years of education: Not on file  . Highest education level: Not on file  Occupational History  . Not on file  Social Needs  . Financial resource strain: Not on file  . Food insecurity:    Worry: Not on file    Inability: Not on file  . Transportation needs:    Medical: Not on file    Non-medical: Not on file  Tobacco Use  . Smoking status: Never Smoker  . Smokeless tobacco: Never Used  Substance and Sexual Activity  . Alcohol use: No    Comment: occ  . Drug use: No  . Sexual activity: Yes    Partners: Male    Comment: 1st intercourse- 37, partners- 1  Lifestyle  . Physical activity:    Days per week: Not on file    Minutes per session: Not on file  . Stress: Not on file  Relationships  . Social connections:    Talks on phone: Not on file    Gets together: Not on file    Attends religious service: Not on file    Active member of club or organization: Not on file    Attends meetings of clubs or organizations: Not on file    Relationship status: Not on file  . Intimate partner violence:    Fear of current or ex partner: Not on file    Emotionally abused: Not on file    Physically abused: Not on file    Forced sexual activity: Not on file  Other Topics Concern  . Not on file  Social History Narrative  . Not on file     Review of Systems  All other systems reviewed and are  negative.      Objective:    Vitals:   03/10/18 1418  BP: 110/76  Pulse: 86  Resp: 16  Temp: 98.2 F (36.8 C)  TempSrc: Oral  SpO2: 96%  Weight: 266 lb (120.7 kg)  Height: 5\' 6"  (1.676 m)      Physical Exam  Constitutional: She is oriented to person, place, and time. She appears well-developed and well-nourished.  Non-toxic appearance. No distress.  Mildly ill-appearing female, no acute distress nontoxic-appearing  HENT:  Head: Normocephalic and atraumatic.  Right Ear: External ear normal.  Left Ear: External ear normal.  Mouth/Throat: Uvula is midline and mucous membranes are normal. No oropharyngeal exudate.  Posterior oropharynx normal, no tonsillar exudate edema or erythema The mucosa diffusely erythematous with moderate discharge No sinus tenderness to palpation Left TM injected superior half, otherwise translucent without effusion or purulence TM normal appearance Bilateral external auditory canals normal in appearance  Eyes: Pupils are equal, round, and reactive to light. Conjunctivae, EOM and lids are normal. Right eye exhibits no discharge. Left eye exhibits no discharge.  Neck: Normal range of motion and phonation normal. Neck supple. No tracheal deviation present.  Cardiovascular: Normal rate, regular rhythm, normal heart sounds and normal pulses. Exam reveals no gallop and no friction rub.  No murmur heard. Pulses:      Radial pulses are 2+ on the right side, and 2+ on the left side.       Posterior tibial pulses are 2+ on the right side, and 2+ on the left side.  Pulmonary/Chest: Effort normal. No stridor. No respiratory distress. She has no wheezes. She has no rhonchi. She has no rales. She exhibits no tenderness.  Bilateral lower lung fields minimally diminished breath sounds, no wheeze, rales or rhonchi, no tachypnea, no retractions or accessory muscle use, intermittent coughing  Abdominal: Soft. Normal appearance and bowel sounds are normal. She exhibits  no distension. There is no tenderness. There is no rebound and no guarding.  Musculoskeletal: Normal range of motion. She exhibits no edema or deformity.  Lymphadenopathy:    She has no cervical adenopathy.  Neurological: She is alert and oriented to person, place, and time. She exhibits normal muscle tone. Coordination and gait normal.  Skin: Skin is warm, dry and intact. Capillary refill takes less than 2 seconds. No rash noted. She is not diaphoretic. No pallor.  Psychiatric: She has a normal mood and affect. Her speech is normal and behavior is normal.  Nursing note and vitals reviewed.  Assessment & Plan:      ICD-10-CM   1. Bronchitis J40 albuterol (PROVENTIL HFA;VENTOLIN HFA) 108 (90 Base) MCG/ACT inhaler    benzonatate (TESSALON) 200 MG capsule    predniSONE (DELTASONE) 20 MG tablet    guaiFENesin (MUCINEX) 600 MG 12 hr tablet    ipratropium-albuterol (DUONEB) 0.5-2.5 (3) MG/3ML nebulizer solution 3 mL  2. Acute non-recurrent pansinusitis J01.40     Suspect viral illness, patient does complain of central chest pain that radiated to back only when coughing, this did resolve completely after breathing treatment in clinic, breath sounds improved to bilateral lower lung fields.  Will treat with steroid burst, albuterol, cough suppressant, Mucinex.  To continue supportive treatment for nasal symptoms.  She will follow-up with [place] if not improving.   Delsa Grana, PA-C 03/10/18 6:35 PM

## 2018-03-15 ENCOUNTER — Encounter: Payer: Self-pay | Admitting: Family Medicine

## 2018-05-04 ENCOUNTER — Encounter: Payer: Self-pay | Admitting: Physician Assistant

## 2018-07-02 LAB — OB RESULTS CONSOLE RPR: RPR: NONREACTIVE

## 2018-07-02 LAB — OB RESULTS CONSOLE ABO/RH: RH Type: POSITIVE

## 2018-07-02 LAB — OB RESULTS CONSOLE ANTIBODY SCREEN: Antibody Screen: NEGATIVE

## 2018-07-02 LAB — OB RESULTS CONSOLE HIV ANTIBODY (ROUTINE TESTING): HIV: NONREACTIVE

## 2018-07-02 LAB — OB RESULTS CONSOLE HEPATITIS B SURFACE ANTIGEN: Hepatitis B Surface Ag: NEGATIVE

## 2018-07-02 LAB — OB RESULTS CONSOLE RUBELLA ANTIBODY, IGM: Rubella: IMMUNE

## 2018-07-12 ENCOUNTER — Other Ambulatory Visit: Payer: Self-pay | Admitting: Physician Assistant

## 2018-07-12 DIAGNOSIS — F410 Panic disorder [episodic paroxysmal anxiety] without agoraphobia: Secondary | ICD-10-CM

## 2018-07-12 DIAGNOSIS — F411 Generalized anxiety disorder: Principal | ICD-10-CM

## 2018-10-04 ENCOUNTER — Ambulatory Visit: Payer: BC Managed Care – PPO | Admitting: Family Medicine

## 2018-10-04 ENCOUNTER — Encounter: Payer: BC Managed Care – PPO | Admitting: Physician Assistant

## 2018-10-07 ENCOUNTER — Ambulatory Visit: Payer: BC Managed Care – PPO | Admitting: Family Medicine

## 2018-10-07 ENCOUNTER — Encounter: Payer: Self-pay | Admitting: Family Medicine

## 2018-10-07 VITALS — BP 120/74 | HR 92 | Temp 98.2°F | Resp 18 | Ht 66.0 in | Wt 282.0 lb

## 2018-10-07 DIAGNOSIS — F410 Panic disorder [episodic paroxysmal anxiety] without agoraphobia: Secondary | ICD-10-CM

## 2018-10-07 DIAGNOSIS — K219 Gastro-esophageal reflux disease without esophagitis: Secondary | ICD-10-CM | POA: Diagnosis not present

## 2018-10-07 DIAGNOSIS — F411 Generalized anxiety disorder: Secondary | ICD-10-CM

## 2018-10-07 MED ORDER — VENLAFAXINE HCL ER 75 MG PO CP24: 75.0000 mg | ORAL_CAPSULE | Freq: Every day | ORAL | 3 refills | Status: DC

## 2018-10-07 NOTE — Progress Notes (Signed)
Subjective:    Patient ID: [name], female    DOB: 04/03/1986, 33 y.o.   MRN: [id number]  HPI Patient presents today for follow-up of her chronic medical problems.  I have reviewed her records from her previous PCP, Karis Juba.  Patient is taking Effexor XR 75 mg p.o. every morning for panic disorder.  Previous medical records indicate that she had gone the emergency room on 2 occasions for chest pain that was felt to be noncardiac in origin.  It was later determined that she was suffering from panic attacks.  Since starting the Effexor, the patient has only had 1 panic attack.  That occurred in the summer.  She awoke 1 night from sleep with her heart racing.  However her husband was able to talk her down and help her to relax.  Otherwise she is doing extremely well.  She denies any side effects from the medication.  She is [redacted] weeks pregnant.  Past history includes pregnancy-induced hypertension complicated by migraines.  They are monitoring her closely for development of preeclampsia.  She is seeing her obstetrician regularly.  They are comfortable with her taking the venlafaxine.  At the present time it does not appear to be affecting her blood pressure as her blood pressure today is 120/74.  We discussed the fact that venlafaxine is pregnancy category C.  I explained to her that this means that we do not know if this medication is safe to take however there has been no convincing studies to prove otherwise.  Patient is comfortable continuing the medication as is her obstetrician. Past Medical History:  Diagnosis Date  . Ankle fracture    hx of rt  . Arm fracture, left   . Panic attacks history of  . Pregnancy induced hypertension in third trimester, postpartum 08/26/2013  . S/P primary low transverse C-section 08/29/2013  . SVD (spontaneous vaginal delivery) 08/29/2013   Past Surgical History:  Procedure Laterality Date  . APPENDECTOMY    . CESAREAN SECTION N/A 08/28/2013   Procedure: CESAREAN SECTION;  Surgeon: Princess Bruins, MD;  Location: [facility];  Service: Obstetrics;  Laterality: N/A;  . CHOLECYSTECTOMY    . tendonitis surgery  2006  . TONSILLECTOMY     Current Outpatient Medications on File Prior to Visit  Medication Sig Dispense Refill  . aspirin 81 MG tablet Take 81 mg by mouth daily.     . cetirizine (ZYRTEC) 10 MG tablet Take 10 mg by mouth daily.    . Famotidine (PEPCID AC MAXIMUM STRENGTH PO)     . Prenatal Vit-Fe Fumarate-FA (PRENATAL VITAMIN PO) Take by mouth.     No current facility-administered medications on file prior to visit.    No Known Allergies Social History   Socioeconomic History  . Marital status: Married    Spouse name: Not on file  . Number of children: Not on file  . Years of education: Not on file  . Highest education level: Not on file  Occupational History  . Not on file  Social Needs  . Financial resource strain: Not on file  . Food insecurity:    Worry: Not on file    Inability: Not on file  . Transportation needs:    Medical: Not on file    Non-medical: Not on file  Tobacco Use  . Smoking status: Never Smoker  . Smokeless tobacco: Never Used  Substance and Sexual Activity  . Alcohol use: No    Comment: occ  . Drug  use: No  . Sexual activity: Yes    Partners: Male    Comment: 1st intercourse- 69, partners- 1  Lifestyle  . Physical activity:    Days per week: Not on file    Minutes per session: Not on file  . Stress: Not on file  Relationships  . Social connections:    Talks on phone: Not on file    Gets together: Not on file    Attends religious service: Not on file    Active member of club or organization: Not on file    Attends meetings of clubs or organizations: Not on file    Relationship status: Not on file  . Intimate partner violence:    Fear of current or ex partner: Not on file    Emotionally abused: Not on file    Physically abused: Not on file    Forced sexual activity: Not on  file  Other Topics Concern  . Not on file  Social History Narrative  . Not on file    Past Medical History:  Diagnosis Date  . Ankle fracture    hx of rt  . Arm fracture, left   . Panic attacks history of  . Pregnancy induced hypertension in third trimester, postpartum 08/26/2013  . S/P primary low transverse C-section 08/29/2013  . SVD (spontaneous vaginal delivery) 08/29/2013   Past Surgical History:  Procedure Laterality Date  . APPENDECTOMY    . CESAREAN SECTION N/A 08/28/2013   Procedure: CESAREAN SECTION;  Surgeon: Princess Bruins, MD;  Location: [facility];  Service: Obstetrics;  Laterality: N/A;  . CHOLECYSTECTOMY    . tendonitis surgery  2006  . TONSILLECTOMY     Current Outpatient Medications on File Prior to Visit  Medication Sig Dispense Refill  . cetirizine (ZYRTEC) 10 MG tablet Take 10 mg by mouth daily.    . Prenatal Vit-Fe Fumarate-FA (PRENATAL VITAMIN PO) Take by mouth.    . venlafaxine XR (EFFEXOR-XR) 75 MG 24 hr capsule TAKE (1) CAPSULE BY MOUTH EACH MORNING. 90 capsule 0   No current facility-administered medications on file prior to visit.    No Known Allergies Social History   Socioeconomic History  . Marital status: Married    Spouse name: Not on file  . Number of children: Not on file  . Years of education: Not on file  . Highest education level: Not on file  Occupational History  . Not on file  Social Needs  . Financial resource strain: Not on file  . Food insecurity:    Worry: Not on file    Inability: Not on file  . Transportation needs:    Medical: Not on file    Non-medical: Not on file  Tobacco Use  . Smoking status: Never Smoker  . Smokeless tobacco: Never Used  Substance and Sexual Activity  . Alcohol use: No    Comment: occ  . Drug use: No  . Sexual activity: Yes    Partners: Male    Comment: 1st intercourse- 21, partners- 1  Lifestyle  . Physical activity:    Days per week: Not on file    Minutes per session: Not on  file  . Stress: Not on file  Relationships  . Social connections:    Talks on phone: Not on file    Gets together: Not on file    Attends religious service: Not on file    Active member of club or organization: Not on file    Attends meetings  of clubs or organizations: Not on file    Relationship status: Not on file  . Intimate partner violence:    Fear of current or ex partner: Not on file    Emotionally abused: Not on file    Physically abused: Not on file    Forced sexual activity: Not on file  Other Topics Concern  . Not on file  Social History Narrative  . Not on file      Review of Systems  All other systems reviewed and are negative.      Objective:   Physical Exam  Constitutional: She is oriented to person, place, and time. She appears well-developed and well-nourished. No distress.  HENT:  Left Ear: Tympanic membrane normal. No tenderness. No mastoid tenderness. Tympanic membrane is not injected, not scarred, not perforated, not erythematous, not retracted and not bulging.  No middle ear effusion.  Neck: Neck supple. No thyromegaly present.  Cardiovascular: Normal rate, regular rhythm and normal heart sounds.  No murmur heard. Pulmonary/Chest: Effort normal and breath sounds normal. No respiratory distress. She has no wheezes. She has no rales.  Abdominal: Soft. Bowel sounds are normal. She exhibits no distension. There is no abdominal tenderness. There is no rebound.  Lymphadenopathy:    She has no cervical adenopathy.  Neurological: She is alert and oriented to person, place, and time. No cranial nerve deficit. She exhibits normal muscle tone. Coordination normal.  Skin: She is not diaphoretic.  Psychiatric: She has a normal mood and affect. Her behavior is normal. Judgment and thought content normal.  Vitals reviewed.         Assessment & Plan:  Panic disorder  Gastroesophageal reflux disease without esophagitis  Generalized anxiety disorder with  panic attacks - Plan: venlafaxine XR (EFFEXOR-XR) 75 MG 24 hr capsule  Panic disorder is well controlled on venlafaxine 75 mg p.o. every morning.  We will continue this medication at his current dose and make no changes.  Patient has discontinued Zantac due to the recent recalls but is currently taking Pepcid for acid reflux.  She has had her flu shot as well as her Tdap.  She is following up regularly with her obstetrician for lab work.  Therefore I would recheck the patient after pregnancy but make no changes at the present time.

## 2018-12-28 ENCOUNTER — Other Ambulatory Visit: Payer: Self-pay | Admitting: Obstetrics and Gynecology

## 2019-01-05 ENCOUNTER — Encounter (HOSPITAL_COMMUNITY): Payer: Self-pay

## 2019-01-05 ENCOUNTER — Other Ambulatory Visit (HOSPITAL_COMMUNITY): Payer: Self-pay | Admitting: Obstetrics and Gynecology

## 2019-01-05 DIAGNOSIS — Z3A34 34 weeks gestation of pregnancy: Secondary | ICD-10-CM

## 2019-01-05 DIAGNOSIS — O3663X0 Maternal care for excessive fetal growth, third trimester, not applicable or unspecified: Secondary | ICD-10-CM

## 2019-01-05 DIAGNOSIS — Z3689 Encounter for other specified antenatal screening: Secondary | ICD-10-CM

## 2019-01-06 ENCOUNTER — Other Ambulatory Visit: Payer: Self-pay

## 2019-01-06 ENCOUNTER — Other Ambulatory Visit (HOSPITAL_COMMUNITY): Payer: Self-pay | Admitting: Obstetrics and Gynecology

## 2019-01-06 ENCOUNTER — Encounter (HOSPITAL_COMMUNITY): Payer: Self-pay

## 2019-01-06 ENCOUNTER — Ambulatory Visit (HOSPITAL_COMMUNITY)
Admission: RE | Admit: 2019-01-06 | Discharge: 2019-01-06 | Disposition: A | Discharge status: Home / Self Care | Payer: BC Managed Care – PPO | Source: Ambulatory Visit | Attending: Obstetrics and Gynecology | Admitting: Obstetrics and Gynecology

## 2019-01-06 ENCOUNTER — Ambulatory Visit (HOSPITAL_COMMUNITY): Payer: BC Managed Care – PPO | Admitting: *Deleted

## 2019-01-06 VITALS — BP 131/87 | HR 98 | Temp 98.4°F

## 2019-01-06 DIAGNOSIS — Z363 Encounter for antenatal screening for malformations: Secondary | ICD-10-CM

## 2019-01-06 DIAGNOSIS — O3660X3 Maternal care for excessive fetal growth, unspecified trimester, fetus 3: Secondary | ICD-10-CM

## 2019-01-06 DIAGNOSIS — O3663X0 Maternal care for excessive fetal growth, third trimester, not applicable or unspecified: Secondary | ICD-10-CM | POA: Diagnosis present

## 2019-01-06 DIAGNOSIS — Z3689 Encounter for other specified antenatal screening: Secondary | ICD-10-CM | POA: Insufficient documentation

## 2019-01-06 DIAGNOSIS — O409XX0 Polyhydramnios, unspecified trimester, not applicable or unspecified: Secondary | ICD-10-CM | POA: Insufficient documentation

## 2019-01-06 DIAGNOSIS — Z3A34 34 weeks gestation of pregnancy: Secondary | ICD-10-CM

## 2019-01-06 DIAGNOSIS — O34219 Maternal care for unspecified type scar from previous cesarean delivery: Secondary | ICD-10-CM

## 2019-01-06 DIAGNOSIS — O403XX0 Polyhydramnios, third trimester, not applicable or unspecified: Secondary | ICD-10-CM | POA: Diagnosis not present

## 2019-01-06 DIAGNOSIS — O99213 Obesity complicating pregnancy, third trimester: Secondary | ICD-10-CM | POA: Diagnosis not present

## 2019-01-20 LAB — OB RESULTS CONSOLE GBS: GBS: POSITIVE

## 2019-01-24 ENCOUNTER — Encounter (HOSPITAL_COMMUNITY): Payer: Self-pay

## 2019-01-24 ENCOUNTER — Telehealth (HOSPITAL_COMMUNITY): Payer: Self-pay | Admitting: *Deleted

## 2019-01-24 NOTE — Telephone Encounter (Signed)
Preadmission screen  

## 2019-01-25 ENCOUNTER — Telehealth (HOSPITAL_COMMUNITY): Payer: Self-pay | Admitting: *Deleted

## 2019-01-25 NOTE — Telephone Encounter (Signed)
Preadmission screen  

## 2019-01-26 ENCOUNTER — Encounter (HOSPITAL_COMMUNITY): Payer: Self-pay

## 2019-01-26 NOTE — Patient Instructions (Signed)
[name]  01/26/2019   Your procedure is scheduled on:  02/06/2019  Arrive at 85 at Entrance C on [address] at [facility]  and [address]. You are invited to use the FREE valet parking or use the Visitor's parking deck.  Pick up the phone at the desk and dial [phone].  Call this number if you have problems the morning of surgery: [phone]  Remember:   Do not eat food:(After Midnight) Desps de medianoche.  Do not drink clear liquids: (After Midnight) Desps de medianoche.  Take these medicines the morning of surgery with A SIP OF WATER:  Take effexor as prescribed   Do not wear jewelry, make-up or nail polish.  Do not wear lotions, powders, or perfumes. Do not wear deodorant.  Do not shave 48 hours prior to surgery.  Do not bring valuables to the hospital.  [facility] is not   responsible for any belongings or valuables brought to the hospital.  Contacts, dentures or bridgework may not be worn into surgery.  Leave suitcase in the car. After surgery it may be brought to your room.  For patients admitted to the hospital, checkout time is 11:00 AM the day of              discharge.      Please read over the following fact sheets that you were given:     Preparing for Surgery

## 2019-02-01 ENCOUNTER — Encounter (HOSPITAL_COMMUNITY)
Admission: AD | Disposition: A | Discharge status: Home / Self Care | Payer: Self-pay | Source: Home / Self Care | Attending: Obstetrics and Gynecology

## 2019-02-01 ENCOUNTER — Other Ambulatory Visit: Payer: Self-pay

## 2019-02-01 ENCOUNTER — Inpatient Hospital Stay (HOSPITAL_COMMUNITY): Payer: BC Managed Care – PPO | Admitting: Anesthesiology

## 2019-02-01 ENCOUNTER — Encounter (HOSPITAL_COMMUNITY): Payer: Self-pay | Admitting: Student

## 2019-02-01 ENCOUNTER — Inpatient Hospital Stay (HOSPITAL_COMMUNITY)
Admission: AD | Admit: 2019-02-01 | Discharge: 2019-02-03 | DRG: 784 | Disposition: A | Discharge status: Home / Self Care | Payer: BC Managed Care – PPO | Attending: Obstetrics and Gynecology | Admitting: Obstetrics and Gynecology

## 2019-02-01 DIAGNOSIS — F411 Generalized anxiety disorder: Secondary | ICD-10-CM | POA: Diagnosis present

## 2019-02-01 DIAGNOSIS — D62 Acute posthemorrhagic anemia: Secondary | ICD-10-CM | POA: Diagnosis not present

## 2019-02-01 DIAGNOSIS — O99344 Other mental disorders complicating childbirth: Secondary | ICD-10-CM | POA: Diagnosis present

## 2019-02-01 DIAGNOSIS — Z3A38 38 weeks gestation of pregnancy: Secondary | ICD-10-CM | POA: Diagnosis not present

## 2019-02-01 DIAGNOSIS — F410 Panic disorder [episodic paroxysmal anxiety] without agoraphobia: Secondary | ICD-10-CM | POA: Diagnosis present

## 2019-02-01 DIAGNOSIS — O34211 Maternal care for low transverse scar from previous cesarean delivery: Principal | ICD-10-CM | POA: Diagnosis present

## 2019-02-01 DIAGNOSIS — O9962 Diseases of the digestive system complicating childbirth: Secondary | ICD-10-CM | POA: Diagnosis present

## 2019-02-01 DIAGNOSIS — Z98891 History of uterine scar from previous surgery: Secondary | ICD-10-CM

## 2019-02-01 DIAGNOSIS — Z302 Encounter for sterilization: Secondary | ICD-10-CM | POA: Diagnosis not present

## 2019-02-01 DIAGNOSIS — O99824 Streptococcus B carrier state complicating childbirth: Secondary | ICD-10-CM | POA: Diagnosis present

## 2019-02-01 DIAGNOSIS — K219 Gastro-esophageal reflux disease without esophagitis: Secondary | ICD-10-CM | POA: Diagnosis present

## 2019-02-01 DIAGNOSIS — O9081 Anemia of the puerperium: Secondary | ICD-10-CM | POA: Diagnosis not present

## 2019-02-01 DIAGNOSIS — O1404 Mild to moderate pre-eclampsia, complicating childbirth: Secondary | ICD-10-CM | POA: Diagnosis present

## 2019-02-01 DIAGNOSIS — O403XX0 Polyhydramnios, third trimester, not applicable or unspecified: Secondary | ICD-10-CM | POA: Diagnosis present

## 2019-02-01 DIAGNOSIS — O1493 Unspecified pre-eclampsia, third trimester: Secondary | ICD-10-CM | POA: Diagnosis present

## 2019-02-01 LAB — PROTEIN / CREATININE RATIO, URINE
Creatinine, Urine: 66.84 mg/dL
Protein Creatinine Ratio: 0.34 mg/mg{Cre} — ABNORMAL HIGH (ref 0.00–0.15)
Total Protein, Urine: 23 mg/dL

## 2019-02-01 LAB — CBC
HCT: 39.2 % (ref 36.0–46.0)
Hemoglobin: 13.3 g/dL (ref 12.0–15.0)
MCH: 30.3 pg (ref 26.0–34.0)
MCHC: 33.9 g/dL (ref 30.0–36.0)
MCV: 89.3 fL (ref 80.0–100.0)
Platelets: 300 10*3/uL (ref 150–400)
RBC: 4.39 MIL/uL (ref 3.87–5.11)
RDW: 12.6 % (ref 11.5–15.5)
WBC: 13.1 10*3/uL — ABNORMAL HIGH (ref 4.0–10.5)
nRBC: 0 % (ref 0.0–0.2)

## 2019-02-01 LAB — URINALYSIS, ROUTINE W REFLEX MICROSCOPIC
Bilirubin Urine: NEGATIVE
Glucose, UA: NEGATIVE mg/dL
Hgb urine dipstick: NEGATIVE
Ketones, ur: 5 mg/dL — AB
Nitrite: NEGATIVE
Protein, ur: NEGATIVE mg/dL
Specific Gravity, Urine: 1.012 (ref 1.005–1.030)
pH: 6 (ref 5.0–8.0)

## 2019-02-01 LAB — COMPREHENSIVE METABOLIC PANEL
ALT: 22 U/L (ref 0–44)
AST: 22 U/L (ref 15–41)
Albumin: 2.8 g/dL — ABNORMAL LOW (ref 3.5–5.0)
Alkaline Phosphatase: 160 U/L — ABNORMAL HIGH (ref 38–126)
Anion gap: 11 (ref 5–15)
BUN: 8 mg/dL (ref 6–20)
CO2: 21 mmol/L — ABNORMAL LOW (ref 22–32)
Calcium: 9.6 mg/dL (ref 8.9–10.3)
Chloride: 103 mmol/L (ref 98–111)
Creatinine, Ser: 0.62 mg/dL (ref 0.44–1.00)
GFR calc Af Amer: >60 mL/min (ref >60)
GFR calc non Af Amer: >60 mL/min (ref >60)
Glucose, Bld: 96 mg/dL (ref 70–99)
Potassium: 4 mmol/L (ref 3.5–5.1)
Sodium: 135 mmol/L (ref 135–145)
Total Bilirubin: 0.5 mg/dL (ref 0.3–1.2)
Total Protein: 6.9 g/dL (ref 6.5–8.1)

## 2019-02-01 LAB — TYPE AND SCREEN
ABO/RH(D): A POS
Antibody Screen: NEGATIVE

## 2019-02-01 SURGERY — Surgical Case
Anesthesia: Spinal | Wound class: Clean Contaminated

## 2019-02-01 MED ORDER — ZOLPIDEM TARTRATE 5 MG PO TABS: 5.0000 mg | ORAL_TABLET | Freq: Every evening | ORAL | Status: DC | PRN

## 2019-02-01 MED ORDER — SENNOSIDES-DOCUSATE SODIUM 8.6-50 MG PO TABS
2.0000 | ORAL_TABLET | ORAL | Status: DC
  Administered 2019-02-01 – 2019-02-03 (×2): 2 via ORAL
  Filled 2019-02-01 (×2): qty 2

## 2019-02-01 MED ORDER — BUPIVACAINE HCL (PF) 0.25 % IJ SOLN
INTRAMUSCULAR | Status: DC | PRN
  Administered 2019-02-01: 30 mL

## 2019-02-01 MED ORDER — MEPERIDINE HCL 25 MG/ML IJ SOLN: 6.2500 mg | INTRAMUSCULAR | Status: DC | PRN

## 2019-02-01 MED ORDER — WITCH HAZEL-GLYCERIN EX PADS: 1.0000 "application " | MEDICATED_PAD | CUTANEOUS | Status: DC | PRN

## 2019-02-01 MED ORDER — SIMETHICONE 80 MG PO CHEW
80.0000 mg | CHEWABLE_TABLET | Freq: Three times a day (TID) | ORAL | Status: DC
  Administered 2019-02-02 – 2019-02-03 (×3): 80 mg via ORAL
  Filled 2019-02-01 (×4): qty 1

## 2019-02-01 MED ORDER — MENTHOL 3 MG MT LOZG: 1.0000 | LOZENGE | OROMUCOSAL | Status: DC | PRN

## 2019-02-01 MED ORDER — MORPHINE SULFATE (PF) 0.5 MG/ML IJ SOLN
INTRAMUSCULAR | Status: DC | PRN
  Administered 2019-02-01: .15 mg via INTRATHECAL

## 2019-02-01 MED ORDER — OXYCODONE-ACETAMINOPHEN 5-325 MG PO TABS
1.0000 | ORAL_TABLET | ORAL | Status: DC | PRN
  Filled 2019-02-01: qty 1

## 2019-02-01 MED ORDER — NALOXONE HCL 4 MG/10ML IJ SOLN
1.0000 ug/kg/h | INTRAVENOUS | Status: DC | PRN
  Filled 2019-02-01: qty 5

## 2019-02-01 MED ORDER — ONDANSETRON HCL 4 MG/2ML IJ SOLN: 4.0000 mg | Freq: Three times a day (TID) | INTRAMUSCULAR | Status: DC | PRN

## 2019-02-01 MED ORDER — IBUPROFEN 800 MG PO TABS
800.0000 mg | ORAL_TABLET | Freq: Four times a day (QID) | ORAL | Status: DC
  Administered 2019-02-02 – 2019-02-03 (×4): 800 mg via ORAL
  Filled 2019-02-01 (×4): qty 1

## 2019-02-01 MED ORDER — DIPHENHYDRAMINE HCL 25 MG PO CAPS: 25.0000 mg | ORAL_CAPSULE | ORAL | Status: DC | PRN

## 2019-02-01 MED ORDER — DIPHENHYDRAMINE HCL 50 MG/ML IJ SOLN: 12.5000 mg | INTRAMUSCULAR | Status: DC | PRN

## 2019-02-01 MED ORDER — NALBUPHINE HCL 10 MG/ML IJ SOLN: 5.0000 mg | INTRAMUSCULAR | Status: DC | PRN

## 2019-02-01 MED ORDER — SCOPOLAMINE 1 MG/3DAYS TD PT72
MEDICATED_PATCH | TRANSDERMAL | Status: AC
  Filled 2019-02-01: qty 1

## 2019-02-01 MED ORDER — SODIUM CHLORIDE 0.9 % IV SOLN
INTRAVENOUS | Status: DC | PRN
  Administered 2019-02-01: 13:00:00 via INTRAVENOUS

## 2019-02-01 MED ORDER — OXYCODONE HCL 5 MG PO TABS: 5.0000 mg | ORAL_TABLET | Freq: Once | ORAL | Status: DC | PRN

## 2019-02-01 MED ORDER — CEFAZOLIN SODIUM-DEXTROSE 1-4 GM/50ML-% IV SOLN
1.0000 g | INTRAVENOUS | Status: DC
  Filled 2019-02-01: qty 50

## 2019-02-01 MED ORDER — PHENYLEPHRINE HCL (PRESSORS) 10 MG/ML IV SOLN
INTRAVENOUS | Status: DC | PRN
  Administered 2019-02-01: 120 ug via INTRAVENOUS
  Administered 2019-02-01 (×4): 80 ug via INTRAVENOUS

## 2019-02-01 MED ORDER — ONDANSETRON HCL 4 MG/2ML IJ SOLN
INTRAMUSCULAR | Status: DC | PRN
  Administered 2019-02-01: 4 mg via INTRAVENOUS

## 2019-02-01 MED ORDER — SIMETHICONE 80 MG PO CHEW
80.0000 mg | CHEWABLE_TABLET | ORAL | Status: DC | PRN
  Administered 2019-02-02 (×2): 80 mg via ORAL
  Filled 2019-02-01: qty 1

## 2019-02-01 MED ORDER — FENTANYL CITRATE (PF) 100 MCG/2ML IJ SOLN
INTRAMUSCULAR | Status: AC
  Filled 2019-02-01: qty 2

## 2019-02-01 MED ORDER — LACTATED RINGERS IV SOLN
INTRAVENOUS | Status: DC
  Administered 2019-02-02: 02:00:00 via INTRAVENOUS

## 2019-02-01 MED ORDER — NALBUPHINE HCL 10 MG/ML IJ SOLN: 5.0000 mg | Freq: Once | INTRAMUSCULAR | Status: DC | PRN

## 2019-02-01 MED ORDER — KETOROLAC TROMETHAMINE 30 MG/ML IJ SOLN
INTRAMUSCULAR | Status: AC
  Filled 2019-02-01: qty 1

## 2019-02-01 MED ORDER — SODIUM CHLORIDE 0.9 % IV SOLN
INTRAVENOUS | Status: DC | PRN
  Administered 2019-02-01: 40 [IU] via INTRAVENOUS

## 2019-02-01 MED ORDER — KETOROLAC TROMETHAMINE 30 MG/ML IJ SOLN: 30.0000 mg | Freq: Four times a day (QID) | INTRAMUSCULAR | Status: DC | PRN

## 2019-02-01 MED ORDER — CEFAZOLIN SODIUM-DEXTROSE 2-4 GM/100ML-% IV SOLN
2.0000 g | INTRAVENOUS | Status: AC
  Administered 2019-02-01: 2 g via INTRAVENOUS
  Administered 2019-02-01: 1 g via INTRAVENOUS
  Filled 2019-02-01: qty 100

## 2019-02-01 MED ORDER — COCONUT OIL OIL: 1.0000 "application " | TOPICAL_OIL | Status: DC | PRN

## 2019-02-01 MED ORDER — TRANEXAMIC ACID-NACL 1000-0.7 MG/100ML-% IV SOLN
INTRAVENOUS | Status: AC
  Filled 2019-02-01: qty 100

## 2019-02-01 MED ORDER — BUPIVACAINE IN DEXTROSE 0.75-8.25 % IT SOLN
INTRATHECAL | Status: DC | PRN
  Administered 2019-02-01: 1.6 mL via INTRATHECAL

## 2019-02-01 MED ORDER — FAMOTIDINE IN NACL 20-0.9 MG/50ML-% IV SOLN
20.0000 mg | Freq: Once | INTRAVENOUS | Status: AC
  Administered 2019-02-01: 11:00:00 20 mg via INTRAVENOUS
  Filled 2019-02-01: qty 50

## 2019-02-01 MED ORDER — DEXAMETHASONE SODIUM PHOSPHATE 10 MG/ML IJ SOLN
10.0000 mg | Freq: Once | INTRAMUSCULAR | Status: AC
  Administered 2019-02-01: 10 mg via INTRAVENOUS
  Filled 2019-02-01: qty 1

## 2019-02-01 MED ORDER — METHYLERGONOVINE MALEATE 0.2 MG PO TABS: 0.2000 mg | ORAL_TABLET | ORAL | Status: DC | PRN

## 2019-02-01 MED ORDER — KETOROLAC TROMETHAMINE 30 MG/ML IJ SOLN
30.0000 mg | Freq: Four times a day (QID) | INTRAMUSCULAR | Status: AC
  Administered 2019-02-01 – 2019-02-02 (×2): 30 mg via INTRAVENOUS
  Filled 2019-02-01 (×2): qty 1

## 2019-02-01 MED ORDER — LACTATED RINGERS IV SOLN
INTRAVENOUS | Status: DC
  Administered 2019-02-01 (×2): via INTRAVENOUS

## 2019-02-01 MED ORDER — FENTANYL CITRATE (PF) 100 MCG/2ML IJ SOLN: 25.0000 ug | INTRAMUSCULAR | Status: DC | PRN

## 2019-02-01 MED ORDER — ONDANSETRON HCL 4 MG/2ML IJ SOLN: 4.0000 mg | Freq: Four times a day (QID) | INTRAMUSCULAR | Status: DC | PRN

## 2019-02-01 MED ORDER — NALOXONE HCL 0.4 MG/ML IJ SOLN: 0.4000 mg | INTRAMUSCULAR | Status: DC | PRN

## 2019-02-01 MED ORDER — BUPIVACAINE-EPINEPHRINE (PF) 0.25% -1:200000 IJ SOLN
INTRAMUSCULAR | Status: AC
  Filled 2019-02-01: qty 30

## 2019-02-01 MED ORDER — METHYLERGONOVINE MALEATE 0.2 MG/ML IJ SOLN: 0.2000 mg | INTRAMUSCULAR | Status: DC | PRN

## 2019-02-01 MED ORDER — TETANUS-DIPHTH-ACELL PERTUSSIS 5-2.5-18.5 LF-MCG/0.5 IM SUSP: 0.5000 mL | Freq: Once | INTRAMUSCULAR | Status: DC

## 2019-02-01 MED ORDER — METOCLOPRAMIDE HCL 5 MG/ML IJ SOLN
10.0000 mg | Freq: Once | INTRAMUSCULAR | Status: AC
  Administered 2019-02-01: 10 mg via INTRAVENOUS
  Filled 2019-02-01: qty 2

## 2019-02-01 MED ORDER — OXYTOCIN 40 UNITS IN NORMAL SALINE INFUSION - SIMPLE MED: 2.5000 [IU]/h | INTRAVENOUS | Status: DC

## 2019-02-01 MED ORDER — SOD CITRATE-CITRIC ACID 490-640 MG/5ML PO SOLN
30.0000 mL | Freq: Once | ORAL | Status: AC
  Administered 2019-02-01: 30 mL via ORAL
  Filled 2019-02-01 (×2): qty 30

## 2019-02-01 MED ORDER — PHENYLEPHRINE HCL-NACL 20-0.9 MG/250ML-% IV SOLN
INTRAVENOUS | Status: AC
  Filled 2019-02-01: qty 250

## 2019-02-01 MED ORDER — DIPHENHYDRAMINE HCL 50 MG/ML IJ SOLN
25.0000 mg | Freq: Once | INTRAMUSCULAR | Status: AC
  Administered 2019-02-01: 06:00:00 25 mg via INTRAVENOUS
  Filled 2019-02-01: qty 1

## 2019-02-01 MED ORDER — SODIUM CHLORIDE 0.9% FLUSH: 3.0000 mL | INTRAVENOUS | Status: DC | PRN

## 2019-02-01 MED ORDER — DIBUCAINE (PERIANAL) 1 % EX OINT: 1.0000 "application " | TOPICAL_OINTMENT | CUTANEOUS | Status: DC | PRN

## 2019-02-01 MED ORDER — VENLAFAXINE HCL ER 75 MG PO CP24
75.0000 mg | ORAL_CAPSULE | Freq: Every day | ORAL | Status: DC
  Administered 2019-02-01 – 2019-02-03 (×3): 75 mg via ORAL
  Filled 2019-02-01 (×4): qty 1

## 2019-02-01 MED ORDER — SCOPOLAMINE 1 MG/3DAYS TD PT72
1.0000 | MEDICATED_PATCH | Freq: Once | TRANSDERMAL | Status: DC
  Filled 2019-02-01: qty 1

## 2019-02-01 MED ORDER — PHENYLEPHRINE 40 MCG/ML (10ML) SYRINGE FOR IV PUSH (FOR BLOOD PRESSURE SUPPORT)
PREFILLED_SYRINGE | INTRAVENOUS | Status: AC
  Filled 2019-02-01: qty 10

## 2019-02-01 MED ORDER — KETOROLAC TROMETHAMINE 30 MG/ML IJ SOLN
30.0000 mg | Freq: Four times a day (QID) | INTRAMUSCULAR | Status: DC | PRN
  Administered 2019-02-01: 15:00:00 30 mg via INTRAMUSCULAR

## 2019-02-01 MED ORDER — DEXAMETHASONE SODIUM PHOSPHATE 4 MG/ML IJ SOLN
INTRAMUSCULAR | Status: DC | PRN
  Administered 2019-02-01: 4 mg via INTRAVENOUS

## 2019-02-01 MED ORDER — DIPHENHYDRAMINE HCL 50 MG/ML IJ SOLN
INTRAMUSCULAR | Status: AC
  Filled 2019-02-01: qty 1

## 2019-02-01 MED ORDER — METOCLOPRAMIDE HCL 5 MG/ML IJ SOLN
INTRAMUSCULAR | Status: DC | PRN
  Administered 2019-02-01: 10 mg via INTRAVENOUS

## 2019-02-01 MED ORDER — PHENYLEPHRINE HCL-NACL 20-0.9 MG/250ML-% IV SOLN
INTRAVENOUS | Status: AC
  Filled 2019-02-01: qty 500

## 2019-02-01 MED ORDER — DIPHENHYDRAMINE HCL 25 MG PO CAPS: 25.0000 mg | ORAL_CAPSULE | Freq: Four times a day (QID) | ORAL | Status: DC | PRN

## 2019-02-01 MED ORDER — OXYCODONE HCL 5 MG/5ML PO SOLN: 5.0000 mg | Freq: Once | ORAL | Status: DC | PRN

## 2019-02-01 MED ORDER — PRENATAL MULTIVITAMIN CH
1.0000 | ORAL_TABLET | Freq: Every day | ORAL | Status: DC
  Administered 2019-02-02: 1 via ORAL
  Filled 2019-02-01: qty 1

## 2019-02-01 MED ORDER — FENTANYL CITRATE (PF) 100 MCG/2ML IJ SOLN
INTRAMUSCULAR | Status: DC | PRN
  Administered 2019-02-01: 15 ug via INTRATHECAL

## 2019-02-01 MED ORDER — PHENYLEPHRINE HCL-NACL 20-0.9 MG/250ML-% IV SOLN
INTRAVENOUS | Status: DC | PRN
  Administered 2019-02-01: 60 ug/min via INTRAVENOUS

## 2019-02-01 MED ORDER — MORPHINE SULFATE (PF) 0.5 MG/ML IJ SOLN
INTRAMUSCULAR | Status: AC
  Filled 2019-02-01: qty 10

## 2019-02-01 MED ORDER — DIPHENHYDRAMINE HCL 50 MG/ML IJ SOLN
INTRAMUSCULAR | Status: DC | PRN
  Administered 2019-02-01: 25 mg via INTRAVENOUS

## 2019-02-01 MED ORDER — OXYTOCIN 40 UNITS IN NORMAL SALINE INFUSION - SIMPLE MED
INTRAVENOUS | Status: AC
  Filled 2019-02-01: qty 1000

## 2019-02-01 MED ORDER — SCOPOLAMINE 1 MG/3DAYS TD PT72
MEDICATED_PATCH | TRANSDERMAL | Status: DC | PRN
  Administered 2019-02-01: 1 via TRANSDERMAL

## 2019-02-01 MED ORDER — LACTATED RINGERS IV SOLN
INTRAVENOUS | Status: DC
  Administered 2019-02-01: 18:00:00 via INTRAVENOUS

## 2019-02-01 MED ORDER — SIMETHICONE 80 MG PO CHEW
80.0000 mg | CHEWABLE_TABLET | ORAL | Status: DC
  Administered 2019-02-01 – 2019-02-03 (×2): 80 mg via ORAL
  Filled 2019-02-01 (×2): qty 1

## 2019-02-01 MED ORDER — METOCLOPRAMIDE HCL 5 MG/ML IJ SOLN
INTRAMUSCULAR | Status: AC
  Filled 2019-02-01: qty 2

## 2019-02-01 MED ORDER — EPHEDRINE 5 MG/ML INJ
INTRAVENOUS | Status: AC
  Filled 2019-02-01: qty 10

## 2019-02-01 SURGICAL SUPPLY — 35 items
CHLORAPREP W/TINT 26ML (MISCELLANEOUS) ×2 IMPLANT
CLAMP CORD UMBIL (MISCELLANEOUS) IMPLANT
CLOTH BEACON ORANGE TIMEOUT ST (SAFETY) ×2 IMPLANT
DERMABOND ADVANCED (GAUZE/BANDAGES/DRESSINGS) ×1
DERMABOND ADVANCED .7 DNX12 (GAUZE/BANDAGES/DRESSINGS) ×1 IMPLANT
DRSG OPSITE POSTOP 4X10 (GAUZE/BANDAGES/DRESSINGS) ×2 IMPLANT
ELECT REM PT RETURN 9FT ADLT (ELECTROSURGICAL) ×2
ELECTRODE REM PT RTRN 9FT ADLT (ELECTROSURGICAL) ×1 IMPLANT
EXTRACTOR VACUUM M CUP 4 TUBE (SUCTIONS) IMPLANT
GLOVE BIO SURGEON STRL SZ7.5 (GLOVE) ×2 IMPLANT
GLOVE BIOGEL PI IND STRL 7.0 (GLOVE) ×1 IMPLANT
GLOVE BIOGEL PI INDICATOR 7.0 (GLOVE) ×1
GOWN STRL REUS W/TWL LRG LVL3 (GOWN DISPOSABLE) ×4 IMPLANT
KIT ABG SYR 3ML LUER SLIP (SYRINGE) IMPLANT
NEEDLE HYPO 22GX1.5 SAFETY (NEEDLE) ×2 IMPLANT
NEEDLE HYPO 25X5/8 SAFETYGLIDE (NEEDLE) IMPLANT
NEEDLE SPNL 20GX3.5 QUINCKE YW (NEEDLE) IMPLANT
NS IRRIG 1000ML POUR BTL (IV SOLUTION) ×2 IMPLANT
PACK C SECTION WH (CUSTOM PROCEDURE TRAY) ×2 IMPLANT
PENCIL SMOKE EVAC W/HOLSTER (ELECTROSURGICAL) ×2 IMPLANT
SUT MNCRL 0 VIOLET CTX 36 (SUTURE) ×2 IMPLANT
SUT MNCRL AB 3-0 PS2 27 (SUTURE) IMPLANT
SUT MON AB 2-0 CT1 27 (SUTURE) ×2 IMPLANT
SUT MON AB-0 CT1 36 (SUTURE) ×4 IMPLANT
SUT MONOCRYL 0 CTX 36 (SUTURE) ×2
SUT PLAIN 0 NONE (SUTURE) IMPLANT
SUT PLAIN 2 0 (SUTURE)
SUT PLAIN 2 0 XLH (SUTURE) ×2 IMPLANT
SUT PLAIN ABS 2-0 CT1 27XMFL (SUTURE) IMPLANT
SUT VIC AB 4-0 KS 27 (SUTURE) ×2 IMPLANT
SYR 20CC LL (SYRINGE) IMPLANT
SYR CONTROL 10ML LL (SYRINGE) ×2 IMPLANT
TOWEL OR 17X24 6PK STRL BLUE (TOWEL DISPOSABLE) ×2 IMPLANT
TRAY FOLEY W/BAG SLVR 14FR LF (SET/KITS/TRAYS/PACK) ×2 IMPLANT
WATER STERILE IRR 1000ML POUR (IV SOLUTION) ×2 IMPLANT

## 2019-02-01 NOTE — MAU Provider Note (Signed)
Patient seen and examined. Consent witnessed and signed. No changes noted. Update completed. BP 126/86   Pulse (!) 108   Temp 98.4 F (36.9 C) (Oral)   Resp 19   Ht 5\' 6"  (1.676 m)   Wt 133.4 kg   LMP 04/30/2018   SpO2 97%   BMI 47.47 kg/m   CBC    Component Value Date/Time   WBC 13.1 (H) 02/01/2019 0601   RBC 4.39 02/01/2019 0601   HGB 13.3 02/01/2019 0601   HCT 39.2 02/01/2019 0601   PLT 300 02/01/2019 0601   MCV 89.3 02/01/2019 0601   MCH 30.3 02/01/2019 0601   MCHC 33.9 02/01/2019 0601   RDW 12.6 02/01/2019 0601   LYMPHSABS 2.0 12/17/2016 2259   MONOABS 0.6 12/17/2016 2259   EOSABS 0.1 12/17/2016 2259   BASOSABS 0.0 12/17/2016 2259

## 2019-02-01 NOTE — Anesthesia Procedure Notes (Signed)
Spinal  Patient location during procedure: OR Start time: 02/01/2019 12:44 PM End time: 02/01/2019 12:46 PM Staffing Anesthesiologist: Albertha Ghee, MD Performed: anesthesiologist  Preanesthetic Checklist Completed: patient identified, surgical consent, pre-op evaluation, timeout performed, IV checked, risks and benefits discussed and monitors and equipment checked Spinal Block Patient position: sitting Prep: DuraPrep Patient monitoring: cardiac monitor, continuous pulse ox and blood pressure Approach: midline Location: L3-4 Injection technique: single-shot Needle Needle type: Pencan  Needle gauge: 24 G Needle length: 9 cm Assessment Sensory level: T10 Additional Notes Functioning IV was confirmed and monitors were applied. Sterile prep and drape, including hand hygiene and sterile gloves were used. The patient was positioned and the spine was prepped. The skin was anesthetized with lidocaine.  Free flow of clear CSF was obtained prior to injecting local anesthetic into the CSF.  The spinal needle aspirated freely following injection.  The needle was carefully withdrawn.  The patient tolerated the procedure well.

## 2019-02-01 NOTE — Progress Notes (Signed)
Spoke with Dr. Ronita Hipps, unable to do c/s at [phone].  Notified OB OR charge Therapist, sports.  Dr. Ronita Hipps to come consent patient and plans to discuss OR time with OR.  Holding patient in MAU for now.

## 2019-02-01 NOTE — Transfer of Care (Signed)
Immediate Anesthesia Transfer of Care Note  Patient: [name]  Procedure(s) Performed: CESAREAN SECTION (N/A )  Patient Location: PACU  Anesthesia Type:Spinal  Level of Consciousness: awake, alert  and oriented  Airway & Oxygen Therapy: Patient Spontanous Breathing  Post-op Assessment: Report given to RN and Post -op Vital signs reviewed and stable  Post vital signs: Reviewed  Last Vitals:  Vitals Value Taken Time  BP    Temp    Pulse    Resp    SpO2      Last Pain:  Vitals:   02/01/19 0800  TempSrc:   PainSc: 7          Complications: No apparent anesthesia complications

## 2019-02-01 NOTE — Progress Notes (Signed)
Dr Ambrose Pancoast with Anesthesia, Fabian November, OB OR charge, and Jenetta Downer, [facility] all notified of repeat C/S.  Waiting for Dr. Ronita Hipps to come see patient.  Planning C/S for [phone].  Patient prepped and ready for OR. Waiting for OR meds to be released from Pharmacy.

## 2019-02-01 NOTE — Progress Notes (Signed)
Patient handed off to [place].  Patient stickers and ANCEF "on call to OR" given to RN with patient.  Verified if report needed to be given to RN or CRNA.  OR RN denied need for further report.  Patient wheeled into OR suite.

## 2019-02-01 NOTE — Anesthesia Preprocedure Evaluation (Signed)
Anesthesia Evaluation  Patient identified by MRN, date of birth, ID band Patient awake    Reviewed: Allergy & Precautions, H&P , NPO status , Patient's Chart, lab work & pertinent test results  Airway Mallampati: II   Neck ROM: full    Dental   Pulmonary neg pulmonary ROS,    breath sounds clear to auscultation       Cardiovascular hypertension,  Rhythm:regular Rate:Normal     Neuro/Psych  Headaches, PSYCHIATRIC DISORDERS Anxiety  Neuromuscular disease    GI/Hepatic GERD  ,  Endo/Other    Renal/GU      Musculoskeletal   Abdominal   Peds  Hematology   Anesthesia Other Findings   Reproductive/Obstetrics (+) Pregnancy                             Anesthesia Physical Anesthesia Plan  ASA: II  Anesthesia Plan: Spinal   Post-op Pain Management:    Induction: Intravenous  PONV Risk Score and Plan: 2 and Ondansetron and Treatment may vary due to age or medical condition  Airway Management Planned: Simple Face Mask  Additional Equipment:   Intra-op Plan:   Post-operative Plan:   Informed Consent: I have reviewed the patients History and Physical, chart, labs and discussed the procedure including the risks, benefits and alternatives for the proposed anesthesia with the patient or authorized representative who has indicated his/her understanding and acceptance.       Plan Discussed with: Anesthesiologist, CRNA and Surgeon  Anesthesia Plan Comments:         Anesthesia Quick Evaluation

## 2019-02-01 NOTE — MAU Note (Addendum)
Took her BP tonight and it was 148/101-usually runs 120s/80s.  Takes a baby aspirin everyday since first trimester for hx of GHTN w/ first pregnancy.  Reports having a HA that started last night-took tylenol at 2230 and her migraine medication at 0345 without relief.  No visual disturbances or epigastric pain.  Feeling some contractions that aren't very painful.  No LOF/VB.  + FM.

## 2019-02-01 NOTE — Op Note (Signed)
Cesarean Section Procedure Note  Indications: patient declines vag del attempt, previous uterine incision kerr x one and Mild PEC  Pre-operative Diagnosis: 38 week 4 day pregnancy. Previous csection Elective sterilization  Post-operative Diagnosis: same  Surgeon: Lovenia Kim   Assistants: Eddie Dibbles, CNM  Anesthesia: Local anesthesia 0.25.% bupivacaine and Spinal anesthesia  ASA Class: 2  Procedure Details  The patient was seen in the Holding Room. The risks, benefits, complications, treatment options, and expected outcomes were discussed with the patient.  The patient concurred with the proposed plan, giving informed consent. The risks of anesthesia, infection, bleeding and possible injury to other organs discussed. Injury to bowel, bladder, or ureter with possible need for repair discussed. Possible need for transfusion with secondary risks of hepatitis or HIV acquisition discussed. Post operative complications to include but not limited to DVT, PE and Pneumonia noted. The site of surgery properly noted/marked. The patient was taken to Operating Room # C, identified as Skyla Champagne and the procedure verified as C-Section Delivery. A Time Out was held and the above information confirmed.  After induction of anesthesia, the patient was draped and prepped in the usual sterile manner. A Pfannenstiel incision was made and carried down through the subcutaneous tissue to the fascia. Fascial incision was made and extended transversely using Mayo scissors. The fascia was separated from the underlying rectus tissue superiorly and inferiorly. The peritoneum was identified and entered. Peritoneal incision was extended longitudinally. The utero-vesical peritoneal reflection was incised transversely and the bladder flap was bluntly freed from the lower uterine segment. A low transverse uterine incision(Kerr hysterotomy) was made. Delivered from OA presentation was a  female with Apgar scores of 7 at one  minute and 9 at five minutes. Bulb suctioning gently performed. Neonatal team in attendance.After the umbilical cord was clamped and cut cord blood was obtained for evaluation. The placenta was removed intact and appeared normal. The uterus was curetted with a dry lap pack. Good hemostasis was noted.The uterine outline, tubes and ovaries appeared normal. The uterine incision was closed with running locked sutures of 0 Monocryl x 1 layers. Hemostasis was observed.  Modified pomeroy bilateral salpingectomy performed without difficulty. Lavage was carried out until clear.The parietal peritoneum was closed with a running 2-0 Monocryl suture. The fascia was then reapproximated with running sutures of 0 Monocryl. The skin was reapproximated with 4-0 vicryl after Stuart closure with 2-0 plain.  Instrument, sponge, and needle counts were correct prior the abdominal closure and at the conclusion of the case.   Findings: FTLF, OA, polyhydramnios  Estimated Blood Loss:  500         Drains: foley                 Specimens: placenta and tubal segments                 Complications:  None; patient tolerated the procedure well.         Disposition: PACU - hemodynamically stable.         Condition: stable  Attending Attestation: I performed the procedure.

## 2019-02-01 NOTE — Progress Notes (Signed)
MOB was referred for history of anxiety/panic attacks.  * Referral screened out by Clinical Social Worker because none of the following criteria appear to apply:  ~ History of anxiety/depression during this pregnancy, or of post-partum depression following prior delivery. ~ Diagnosis of anxiety and/or depression within last 3 years. Per chart review, MOB diagnosed with anxiety/panic attacks prior to 2012. Per [facility] records, MOB's anxiety is currently stable on Effexor.  OR * MOB's symptoms currently being treated with medication and/or therapy.  Please contact the Clinical Social Worker if needs arise, by [facility] request, or if MOB scores greater than 9/yes to question 10 on Edinburgh Postpartum Depression Screen.  Ollen Barges, [place]  [facility] [employer] [phone]

## 2019-02-01 NOTE — H&P (Signed)
[name] is a 33 y.o. female presenting for rpt csection and TL. OB History    Gravida  3   Para  1   Term  1   Preterm      AB  1   Living  1     SAB  1   TAB      Ectopic      Multiple      Live Births  1          Past Medical History:  Diagnosis Date  . Ankle fracture    hx of rt  . Arm fracture, left   . Carpal tunnel syndrome during pregnancy   . Headache   . Hx of pre-eclampsia in prior pregnancy, currently pregnant   . Panic attacks history of  . Pregnancy induced hypertension 2014   Past Surgical History:  Procedure Laterality Date  . APPENDECTOMY    . CESAREAN SECTION N/A 08/28/2013   Procedure: CESAREAN SECTION;  Surgeon: Princess Bruins, MD;  Location: [facility];  Service: Obstetrics;  Laterality: N/A;  . CHOLECYSTECTOMY    . tendonitis surgery  2006  . TONSILLECTOMY     Family History: family history includes Cancer in her maternal grandfather and maternal uncle; Heart attack in her maternal grandfather; Stroke in her paternal grandmother. Social History:  reports that she has never smoked. She has never used smokeless tobacco. She reports that she does not drink alcohol or use drugs.     Maternal Diabetes: No Genetic Screening: Normal Maternal Ultrasounds/Referrals: Normal Fetal Ultrasounds or other Referrals:  None Maternal Substance Abuse:  No Significant Maternal Medications:  None Significant Maternal Lab Results:  None Other Comments:  None  Review of Systems  Constitutional: Negative.   All other systems reviewed and are negative.  Maternal Medical History:  Reason for admission: Contractions.   Contractions: Onset was 1-2 hours ago.   Perceived severity is mild.    Fetal activity: Perceived fetal activity is normal.   Last perceived fetal movement was within the past hour.    Prenatal complications: PIH and polyhydramnios.   Prenatal Complications - Diabetes: none.    Dilation: 1 Exam by:: Esau Grew,  RN Blood pressure 117/65, pulse 92, temperature 98.4 F (36.9 C), temperature source Oral, resp. rate 19, height 5\' 6"  (1.676 m), weight 133.4 kg, last menstrual period 04/30/2018, SpO2 96 %. Maternal Exam:  Uterine Assessment: Contraction strength is mild.  Contraction frequency is irregular.   Abdomen: Patient reports no abdominal tenderness. Surgical scars: low transverse.   Fetal presentation: vertex  Introitus: Normal vulva. Normal vagina.  Ferning test: not done.  Nitrazine test: not done. Amniotic fluid character: not assessed.  Pelvis: questionable for delivery.      Physical Exam  Nursing note and vitals reviewed. Constitutional: She is oriented to person, place, and time. She appears well-developed and well-nourished.  Neck: Normal range of motion. Neck supple.  Cardiovascular: Normal rate and regular rhythm.  Respiratory: Effort normal and breath sounds normal.  GI: Soft. Bowel sounds are normal.  Genitourinary:    Vulva, vagina and uterus normal.   Musculoskeletal: Normal range of motion.  Neurological: She is alert and oriented to person, place, and time. She has normal reflexes.  Skin: Skin is warm and dry.  Psychiatric: She has a normal mood and affect.    Prenatal labs: ABO, Rh: --/--/A POS (04/28 0601) Antibody: NEG (04/28 0601) Rubella: Immune (09/27 0000) RPR: Nonreactive (09/27 0000)  HBsAg: Negative (09/27 0000)  HIV: Non-reactive (09/27 0000)  GBS: Positive (04/16 0000)   Assessment/Plan: 38 wk IUP Previous csection for rpt Polyhydramnios Rpt csection Tubal ligation Consent done   Marji Kuehnel J 02/01/2019, 8:25 AM

## 2019-02-01 NOTE — MAU Provider Note (Signed)
Chief Complaint  Patient presents with  . Hypertension  . Headache     First Provider Initiated Contact with Patient 02/01/19 0538      S: [name]  is a 33 y.o. y.o. year old G27P1011 female at [redacted]w[redacted]d weeks gestation who presents to MAU with elevated blood pressures. Reports Hx of gestational hypertension in previous pregnancy. Current blood pressure medication: BASA.  Reports headache since last night. Headache not improved with medication & her BP at home was 140s/90s.  Took 2 ES tylenol last night around 11 pm & 2 fioricet at 345 am - no relief in symptoms.  Denies visual disturbance of epigastric pain. Has some nausea & contractions.  Associated symptoms: Endorses Headache, Denies vision changes, denies epigastric pain Contractions: irregular & mild Vaginal bleeding: none Fetal movement: normal  O:  Patient Vitals for the past 24 hrs:  BP Temp Temp src Pulse Resp SpO2 Height Weight  02/01/19 0700 121/79 - - 86 - - - -  02/01/19 0656 121/79 - - - 19 - - -  02/01/19 0646 102/66 - - 73 - - - -  02/01/19 0631 124/89 - - 93 - - - -  02/01/19 0616 (!) 146/107 - - 98 - - - -  02/01/19 0600 (!) 152/111 - - (!) 106 - 98 % - -  02/01/19 0538 (!) 142/85 - - 99 - - - -  02/01/19 0516 (!) 132/99 - - (!) 119 - - - -  02/01/19 0506 (!) 150/103 98.4 F (36.9 C) Oral (!) 107 20 - - -  02/01/19 0457 - - - - - - 5\' 6"  (1.676 m) 133.4 kg   General: NAD Heart: Regular rate Lungs: Normal rate and effort Abd: Soft, NT, Gravid, S=D Extremities: non pitting Pedal edema Neuro: 2+ deep tendon reflexes, No clonus Pelvic: NEFG, no bleeding or LOF.   Dilation: 1 Cervical Position: Anterior Exam by:: Esau Grew, RN  NST:  Baseline: 120 bpm, Variability: Good {> 6 bpm), Accelerations: Reactive and Decelerations: Absent  Results for orders placed or performed during the hospital encounter of 02/01/19 (from the past 24 hour(s))  Urinalysis, Routine w reflex microscopic     Status: Abnormal    Collection Time: 02/01/19  5:02 AM  Result Value Ref Range   Color, Urine YELLOW YELLOW   APPearance CLEAR CLEAR   Specific Gravity, Urine 1.012 1.005 - 1.030   pH 6.0 5.0 - 8.0   Glucose, UA NEGATIVE NEGATIVE mg/dL   Hgb urine dipstick NEGATIVE NEGATIVE   Bilirubin Urine NEGATIVE NEGATIVE   Ketones, ur 5 (A) NEGATIVE mg/dL   Protein, ur NEGATIVE NEGATIVE mg/dL   Nitrite NEGATIVE NEGATIVE   Leukocytes,Ua TRACE (A) NEGATIVE   RBC / HPF 0-5 0 - 5 RBC/hpf   WBC, UA 0-5 0 - 5 WBC/hpf   Bacteria, UA RARE (A) NONE SEEN   Squamous Epithelial / LPF 0-5 0 - 5   Mucus PRESENT   Protein / creatinine ratio, urine     Status: Abnormal   Collection Time: 02/01/19  5:02 AM  Result Value Ref Range   Creatinine, Urine 66.84 mg/dL   Total Protein, Urine 23 mg/dL   Protein Creatinine Ratio 0.34 (H) 0.00 - 0.15 mg/mg[Cre]  CBC     Status: Abnormal   Collection Time: 02/01/19  6:01 AM  Result Value Ref Range   WBC 13.1 (H) 4.0 - 10.5 K/uL   RBC 4.39 3.87 - 5.11 MIL/uL   Hemoglobin 13.3 12.0 -  15.0 g/dL   HCT 39.2 36.0 - 46.0 %   MCV 89.3 80.0 - 100.0 fL   MCH 30.3 26.0 - 34.0 pg   MCHC 33.9 30.0 - 36.0 g/dL   RDW 12.6 11.5 - 15.5 %   Platelets 300 150 - 400 K/uL   nRBC 0.0 0.0 - 0.2 %  Comprehensive metabolic panel     Status: Abnormal   Collection Time: 02/01/19  6:01 AM  Result Value Ref Range   Sodium 135 135 - 145 mmol/L   Potassium 4.0 3.5 - 5.1 mmol/L   Chloride 103 98 - 111 mmol/L   CO2 21 (L) 22 - 32 mmol/L   Glucose, Bld 96 70 - 99 mg/dL   BUN 8 6 - 20 mg/dL   Creatinine, Ser 0.62 0.44 - 1.00 mg/dL   Calcium 9.6 8.9 - 10.3 mg/dL   Total Protein 6.9 6.5 - 8.1 g/dL   Albumin 2.8 (L) 3.5 - 5.0 g/dL   AST 22 15 - 41 U/L   ALT 22 0 - 44 U/L   Alkaline Phosphatase 160 (H) 38 - 126 U/L   Total Bilirubin 0.5 0.3 - 1.2 mg/dL   GFR calc non Af Amer >60 >60 mL/min   GFR calc Af Amer >60 >60 mL/min   Anion gap 11 5 - 15    A:  1. Pre-eclampsia in third trimester  -new onset  hypertension & elevated protein creatinine ratio. Headache not improved after oral & IV meds. Severe range BP x 1.   2. [redacted] weeks gestation of pregnancy      P:  Dr. Ronita Hipps notified of patient's evaluation. Pt is for a repeat c/section. He will admit her for c/section later today.   Jorje Guild, NP 02/01/2019 7:37 AM

## 2019-02-02 LAB — ABO/RH: ABO/RH(D): A POS

## 2019-02-02 LAB — CBC
HCT: 33.3 % — ABNORMAL LOW (ref 36.0–46.0)
Hemoglobin: 11.5 g/dL — ABNORMAL LOW (ref 12.0–15.0)
MCH: 31.2 pg (ref 26.0–34.0)
MCHC: 34.5 g/dL (ref 30.0–36.0)
MCV: 90.2 fL (ref 80.0–100.0)
Platelets: 270 10*3/uL (ref 150–400)
RBC: 3.69 MIL/uL — ABNORMAL LOW (ref 3.87–5.11)
RDW: 12.7 % (ref 11.5–15.5)
WBC: 15.8 10*3/uL — ABNORMAL HIGH (ref 4.0–10.5)
nRBC: 0 % (ref 0.0–0.2)

## 2019-02-02 LAB — COMPREHENSIVE METABOLIC PANEL
ALT: 20 U/L (ref 0–44)
AST: 26 U/L (ref 15–41)
Albumin: 2.5 g/dL — ABNORMAL LOW (ref 3.5–5.0)
Alkaline Phosphatase: 133 U/L — ABNORMAL HIGH (ref 38–126)
Anion gap: 11 (ref 5–15)
BUN: 10 mg/dL (ref 6–20)
CO2: 22 mmol/L (ref 22–32)
Calcium: 9.1 mg/dL (ref 8.9–10.3)
Chloride: 103 mmol/L (ref 98–111)
Creatinine, Ser: 0.66 mg/dL (ref 0.44–1.00)
GFR calc Af Amer: >60 mL/min (ref >60)
GFR calc non Af Amer: >60 mL/min (ref >60)
Glucose, Bld: 105 mg/dL — ABNORMAL HIGH (ref 70–99)
Potassium: 3.9 mmol/L (ref 3.5–5.1)
Sodium: 136 mmol/L (ref 135–145)
Total Bilirubin: 0.4 mg/dL (ref 0.3–1.2)
Total Protein: 5.9 g/dL — ABNORMAL LOW (ref 6.5–8.1)

## 2019-02-02 LAB — RPR: RPR Ser Ql: NONREACTIVE

## 2019-02-02 LAB — BIRTH TISSUE RECOVERY COLLECTION (PLACENTA DONATION)

## 2019-02-02 MED ORDER — ACETAMINOPHEN 500 MG PO TABS
1000.0000 mg | ORAL_TABLET | Freq: Four times a day (QID) | ORAL | Status: DC | PRN
  Administered 2019-02-03: 1000 mg via ORAL
  Filled 2019-02-02: qty 2

## 2019-02-02 MED ORDER — OXYCODONE HCL 5 MG PO TABS
5.0000 mg | ORAL_TABLET | ORAL | Status: DC | PRN
  Administered 2019-02-02 – 2019-02-03 (×4): 5 mg via ORAL
  Filled 2019-02-02 (×5): qty 1

## 2019-02-02 NOTE — Progress Notes (Signed)
POSTOPERATIVE DAY # 1 S/P Repeat LTCS with desired sterilization  s/p bilateral salpingectomy, baby girl    S:         Reports feeling okay, having some incisional pain   Denies HA, visual changes, RUQ/epigastric pain              Tolerating po intake / no nausea / no vomiting / + flatus / no BM  Denies dizziness, SOB, or CP             Bleeding is light             Pain somewhat controlled with Motrin, wants to avoid narcotics             Up ad lib / ambulatory/ voiding QS  Newborn is formula feeding only   O:  VS: BP 110/70 (BP Location: Right Arm)   Pulse 69   Temp 98.3 F (36.8 C) (Oral)   Resp 16   Ht 5\' 6"  (1.676 m)   Wt 133.4 kg   LMP 04/30/2018   SpO2 99%   BMI 47.47 kg/m  02/02/19 0542  98.3 F (36.8 C)  69  -  16  110/70  Semi-fowlers  -  -  - JM   02/02/19 0146  98.4 F (36.9 C)  71  -  16  105/64  Semi-fowlers  99 %  -  - JM   02/01/19 2128  -  88  -  -  113/79  Standing  -  -  - JM   02/01/19 2119  -  83  -  -  121/78  Sitting  -  -  - JM   02/01/19 2115  -  79  -  -  120/82  Lying  -  -  - JM   02/01/19 2106  98.3 F (36.8 C)  81  -  18  112/72  High-fowlers  -  -  - JM   02/01/19 1806  -  -  -  -  -  -  97 %  -  - MM   02/01/19 1615  98.3 F (36.8 C)  77  -  18  123/61  Semi-fowlers  97 %  -  - MM   02/01/19 1517  98.7 F (37.1 C)  77  -  18  104/64  Semi-fowlers  97 %  Room Air  - CW   02/01/19 1500  98.1 F (36.7 C)  84  -  -  112/67  Semi-fowlers  98 %  Room Air  - CW   02/01/19 1445  -  82  79  20  101/61  Semi-fowlers  97 %  Room Air  - CW   02/01/19 1430  -  84  -  20  131/65  Semi-fowlers  96 %  Room Air  - CW   02/01/19 1415  -  84  82  21Abnormal   111/58Abnormal   Semi-fowlers  98 %  Room Air  - CW   02/01/19 1400  -  86  86  20  111/60  Semi-fowlers  96 %  Room Air  - CW   02/01/19 1351  98.4 F (36.9 C)  -  -  18  117/61  Semi-fowlers  97 %  Room Air  - CW   02/01/19 1215  -  108Abnormal   -  -  126/86  -  -  LABS:                Recent Labs    02/01/19 0601 02/02/19 0622  WBC 13.1* 15.8*  HGB 13.3 11.5*  PLT 300 270               Bloodtype: --/--/A POS, A POS (04/28 0601)  Rubella: Immune (09/27 0000)                                             I&O: Intake/Output      04/28 0701 - 04/29 0700 04/29 0701 - 04/30 0700   I.V. (mL/kg) 3639.7 (27.3)    Other 960    Total Intake(mL/kg) 4599.7 (34.5)    Urine (mL/kg/hr) 1625 (0.5)    Blood 144    Total Output 1769    Net +2830.7                      Physical Exam:             Alert and Oriented X3  Lungs: Clear and unlabored  Heart: regular rate and rhythm / no murmurs  Abdomen: soft, non-tender, non-distended, active bowel sounds in all quadrants             Fundus: firm, non-tender, U-1             Dressing: honeycomb dressing with Dermabond              Incision:  approximated with sutures / no erythema / no ecchymosis / no drainage  Perineum: intact  Lochia: small, no clots   Extremities: +1 LE edema, no calf pain or tenderness, +2 DTRs, no clonus bilaterally  A:        POD # 1 S/P Repeat LTCS with bilateral salpingectomy             Mild Pre-eclampsia    - BPs stable   - No neural s/s    - Has been monitoring BPs at home and desires early d/c tomorrow; will plan for BP checks at home and report to office   Mild ABL Anemia   - stable, asymptomatic  Anxiety    - on Effexor 75mg  daily   Routine postoperative care              May shower today  Breast care for formula feeding mothers discussed  Encouraged to rest when baby rests  Anticipate early d/c home tomorrow   Lars Pinks, MSN, CNM [facility]

## 2019-02-02 NOTE — Anesthesia Postprocedure Evaluation (Signed)
Anesthesia Post Note  Patient: [name]  Procedure(s) Performed: CESAREAN SECTION (N/A )     Patient location during evaluation: PACU Anesthesia Type: Spinal Level of consciousness: oriented and awake and alert Pain management: pain level controlled Vital Signs Assessment: post-procedure vital signs reviewed and stable Respiratory status: spontaneous breathing, respiratory function stable and patient connected to nasal cannula oxygen Cardiovascular status: blood pressure returned to baseline and stable Postop Assessment: no headache, no backache and no apparent nausea or vomiting Anesthetic complications: no    Last Vitals:  Vitals:   02/02/19 0925 02/02/19 1500  BP: 114/74 127/71  Pulse: 77 81  Resp: 18 18  Temp: 36.9 C 37 C  SpO2: 98% 98%    Last Pain:  Vitals:   02/02/19 1525  TempSrc:   PainSc: 6    Pain Goal: Patients Stated Pain Goal: 4 (02/02/19 0925)                 Trachelle Low S

## 2019-02-03 ENCOUNTER — Encounter (HOSPITAL_COMMUNITY): Payer: Self-pay

## 2019-02-03 MED ORDER — OXYCODONE HCL 5 MG PO TABS: 5.0000 mg | ORAL_TABLET | ORAL | 0 refills | Status: DC | PRN

## 2019-02-03 MED ORDER — SENNOSIDES-DOCUSATE SODIUM 8.6-50 MG PO TABS: 2.0000 | ORAL_TABLET | ORAL | Status: DC

## 2019-02-03 MED ORDER — SIMETHICONE 80 MG PO CHEW: 80.0000 mg | CHEWABLE_TABLET | ORAL | 0 refills | Status: DC | PRN

## 2019-02-03 MED ORDER — IBUPROFEN 800 MG PO TABS: 800.0000 mg | ORAL_TABLET | Freq: Four times a day (QID) | ORAL | 0 refills | Status: DC

## 2019-02-03 NOTE — Discharge Summary (Signed)
OB Discharge Summary  Patient Name: [name] DOB: [birth year] MRN: [id number]  Date of admission: 02/01/2019 Delivering provider: Brien Few   Date of discharge: 02/03/2019  Admitting diagnosis: 38WKS HBP Intrauterine pregnancy: [redacted]w[redacted]d     Secondary diagnosis:Principal Problem:   Postpartum care following cesarean delivery (4/28) Active Problems:   Preeclampsia, third trimester   Previous cesarean section   R C/S w BTL  Additional problems:none     Discharge diagnosis:  Patient Active Problem List   Diagnosis Date Noted  . Preeclampsia, third trimester 02/01/2019  . Previous cesarean section 02/01/2019  . R C/S w BTL 02/01/2019  . GERD (gastroesophageal reflux disease) 03/30/2017  . Generalized anxiety disorder 03/30/2017  . Panic disorder 03/30/2017  . Postpartum care following cesarean delivery (4/28) 08/29/2013                                                                Post partum procedures:none Complications: None   Hospital course:  Sceduled C/S   33 y.o. yo G3P1011 at [redacted]w[redacted]d was admitted to the hospital 02/01/2019 for scheduled cesarean section with the following indication:Elective Repeat and BTL.  Membrane Rupture Time/Date: 1:03 PM ,02/01/2019   Patient delivered a Viable infant.02/01/2019  Details of operation can be found in separate operative note.  Pateint had an uncomplicated postpartum course.  She is ambulating, tolerating a regular diet, passing flatus, and urinating well. Patient is discharged home in stable condition on  02/03/19         Physical exam  Vitals:   02/02/19 0925 02/02/19 1500 02/02/19 2244 02/03/19 0604  BP: 114/74 127/71 (!) 108/48 133/85  Pulse: 77 81 75 79  Resp: 18 18 18 18   Temp: 98.4 F (36.9 C) 98.6 F (37 C) 98.6 F (37 C) 98.3 F (36.8 C)  TempSrc:   Oral Oral  SpO2: 98% 98%  98%  Weight:      Height:       General: alert, cooperative and no distress Lochia: appropriate Uterine Fundus: firm Incision:  Healing well with no significant drainage DVT Evaluation: No cords or calf tenderness. Calf/Ankle edema is present Labs: Lab Results  Component Value Date   WBC 15.8 (H) 02/02/2019   HGB 11.5 (L) 02/02/2019   HCT 33.3 (L) 02/02/2019   MCV 90.2 02/02/2019   PLT 270 02/02/2019   CMP Latest Ref Rng & Units 02/02/2019  Glucose 70 - 99 mg/dL 105(H)  BUN 6 - 20 mg/dL 10  Creatinine 0.44 - 1.00 mg/dL 0.66  Sodium 135 - 145 mmol/L 136  Potassium 3.5 - 5.1 mmol/L 3.9  Chloride 98 - 111 mmol/L 103  CO2 22 - 32 mmol/L 22  Calcium 8.9 - 10.3 mg/dL 9.1  Total Protein 6.5 - 8.1 g/dL 5.9(L)  Total Bilirubin 0.3 - 1.2 mg/dL 0.4  Alkaline Phos 38 - 126 U/L 133(H)  AST 15 - 41 U/L 26  ALT 0 - 44 U/L 20   Discharge instruction: per After Visit Summary and "Baby and Me Booklet".  After Visit Meds:  Allergies as of 02/03/2019      Reactions   Adhesive [tape] Rash   steristrips      Medication List    TAKE these medications   acetaminophen 500 MG tablet Commonly known as:  TYLENOL Take 1,000 mg by mouth every 6 (six) hours as needed for moderate pain or headache.   aspirin 81 MG tablet Take 81 mg by mouth at bedtime.   butalbital-acetaminophen-caffeine 50-325-40 MG tablet Commonly known as:  FIORICET Take by mouth 2 (two) times daily as needed for headache.   calcium carbonate 500 MG chewable tablet Commonly known as:  TUMS - dosed in mg elemental calcium Chew 2 tablets by mouth 3 (three) times daily as needed for indigestion or heartburn.   cetirizine 10 MG tablet Commonly known as:  ZYRTEC Take 10 mg by mouth daily.   famotidine 20 MG tablet Commonly known as:  PEPCID Take 20 mg by mouth See admin instructions. Take 20 mg daily, may take an additional 20 mg dose as needed for heartburn   fluticasone 50 MCG/ACT nasal spray Commonly known as:  FLONASE Place 1 spray into both nostrils daily as needed for allergies or rhinitis.   ibuprofen 800 MG tablet Commonly known as:   ADVIL Take 1 tablet (800 mg total) by mouth every 6 (six) hours.   oxyCODONE 5 MG immediate release tablet Commonly known as:  Oxy IR/ROXICODONE Take 1-2 tablets (5-10 mg total) by mouth every 4 (four) hours as needed for severe pain.   prenatal multivitamin Tabs tablet Take 1 tablet by mouth at bedtime.   senna-docusate 8.6-50 MG tablet Commonly known as:  Senokot-S Take 2 tablets by mouth daily. Start taking on:  Feb 04, 2019   simethicone 80 MG chewable tablet Commonly known as:  MYLICON Chew 1 tablet (80 mg total) by mouth as needed for flatulence.   venlafaxine XR 75 MG 24 hr capsule Commonly known as:  EFFEXOR-XR Take 1 capsule (75 mg total) by mouth daily with breakfast.       Diet: routine diet  Activity: Advance as tolerated. Pelvic rest for 6 weeks.   Postpartum contraception: Not Discussed  Newborn Data: Live born female  Birth Weight: 8 lb 7.8 oz (3850 g) APGAR: 7, 8  Newborn Delivery   Birth date/time:  02/01/2019 13:04:00 Delivery type:  C-Section, Low Transverse Trial of labor:  No C-section categorization:  Repeat     named [name] Baby Feeding: Bottle Disposition:home with mother   Delivery Report:  Review the Delivery Report for details.    Follow up: Follow-up Information    Brien Few, MD. Schedule an appointment as soon as possible for a visit in 6 week(s).   Specialty:  Obstetrics and Gynecology Contact information: [place] [address] [phone]             Signed: Otilio Carpen, MSN 02/03/2019, 10:32 AM

## 2019-02-03 NOTE — Progress Notes (Signed)
Patient ID: [name], female   DOB: [birth year], 33 y.o.   MRN: [id number] Subjective: POD# 2 Live born female  Birth Weight: 8 lb 7.8 oz (3850 g) APGAR: 7, 8  Newborn Delivery   Birth date/time:  02/01/2019 13:04:00 Delivery type:  C-Section, Low Transverse Trial of labor:  No C-section categorization:  Repeat    Baby name: [name] Delivering provider: Brien Newton   Feeding: bottle  Pain control at delivery:   spinal  Reports feeling well, desires early DC  Patient reports tolerating PO.   Breast symptoms: not breastfeeding Pain controlled with PO meds Denies HA/SOB/C/P/N/V/dizziness. Flatus present, + BM. She reports vaginal bleeding as normal, without clots.  She is ambulating, urinating without difficulty.     Objective:   VS:    Vitals:   02/02/19 0925 02/02/19 1500 02/02/19 2244 02/03/19 0604  BP: 114/74 127/71 (!) 108/48 133/85  Pulse: 77 81 75 79  Resp: 18 18 18 18   Temp: 98.4 F (36.9 C) 98.6 F (37 C) 98.6 F (37 C) 98.3 F (36.8 C)  TempSrc:   Oral Oral  SpO2: 98% 98%  98%  Weight:      Height:          Intake/Output Summary (Last 24 hours) at 02/03/2019 1027 Last data filed at 02/02/2019 1300 Gross per 24 hour  Intake 360 ml  Output 625 ml  Net -265 ml        Recent Labs    02/01/19 0601 02/02/19 0622  WBC 13.1* 15.8*  HGB 13.3 11.5*  HCT 39.2 33.3*  PLT 300 270     Blood type: --/--/A POS, A POS (04/28 0601)  Rubella: Immune (09/27 0000)         Physical Exam:  General: alert, cooperative and no distress CV: Regular rate and rhythm Resp: clear Abdomen: soft, nontender, normal bowel sounds Incision: clean, dry and intact Uterine Fundus: firm, below umbilicus, nontender Lochia: minimal Ext: edema +1 pedal      Assessment/Plan: 33 y.o.   POD# 2. [id number]                  Principal Problem:   Postpartum care following cesarean delivery (4/28) Active Problems:   Preeclampsia, third trimester   Previous cesarean  section   R C/S w BTL    Mild Pre-eclampsia                          - BPs stable                         - No neural s/s                          - Has been monitoring BPs at home and desires early d/c tomorrow; will plan for BP checks at home and report to office              Mild ABL Anemia                         - stable, asymptomatic             Anxiety                          - on Effexor 75mg  daily  Routine postoperative care              Breast care for formula feeding mothers discussed             Encouraged to rest when baby rests             DC home today w/ instructions  F/U at [facility] in 6 weeks and PRN  [name], CNM, MSN 02/03/2019, 10:27 AM

## 2019-02-04 ENCOUNTER — Encounter (HOSPITAL_COMMUNITY)
Admission: RE | Admit: 2019-02-04 | Discharge: 2019-02-04 | Disposition: A | Discharge status: Home / Self Care | Payer: BC Managed Care – PPO | Source: Ambulatory Visit

## 2019-02-04 HISTORY — DX: Carpal tunnel syndrome, unspecified upper limb: O26.899

## 2019-02-04 HISTORY — DX: Gestational (pregnancy-induced) hypertension without significant proteinuria, unspecified trimester: O13.9

## 2019-02-04 HISTORY — DX: Headache: R51

## 2019-02-04 HISTORY — DX: Carpal tunnel syndrome, unspecified upper limb: G56.00

## 2019-02-04 HISTORY — DX: Headache, unspecified: R51.9

## 2019-02-04 HISTORY — DX: Supervision of pregnancy with other poor reproductive or obstetric history, unspecified trimester: O09.299

## 2019-02-06 ENCOUNTER — Encounter (HOSPITAL_COMMUNITY): Payer: Self-pay

## 2019-02-06 ENCOUNTER — Inpatient Hospital Stay (HOSPITAL_COMMUNITY): Admit: 2019-02-06 | Payer: Self-pay | Admitting: Obstetrics and Gynecology

## 2019-02-06 SURGERY — Surgical Case
Anesthesia: Spinal

## 2019-04-01 ENCOUNTER — Other Ambulatory Visit: Payer: Self-pay

## 2019-04-01 ENCOUNTER — Ambulatory Visit (INDEPENDENT_AMBULATORY_CARE_PROVIDER_SITE_OTHER): Payer: BC Managed Care – PPO | Admitting: Family Medicine

## 2019-04-01 DIAGNOSIS — Z20828 Contact with and (suspected) exposure to other viral communicable diseases: Secondary | ICD-10-CM | POA: Diagnosis not present

## 2019-04-01 DIAGNOSIS — Z20822 Contact with and (suspected) exposure to covid-19: Secondary | ICD-10-CM

## 2019-04-01 NOTE — Progress Notes (Signed)
Subjective:    Patient ID: [name], female    DOB: Apr 05, 1986, 33 y.o.   MRN: [id number]  HPI Patient is a very pleasant 33 year old Caucasian female being seen today as a telephone visit.  She consents to be seen by telephone.  She is being seen simultaneously with her husband as well on a conference call as he is my next scheduled appointment.  Phone call began for both of them at 2:00.  Phone call concluded at 212.  Patient was visiting her uncle on Saturday.  They were sitting outside on the patio.  They were not wearing any mask but they were staying approximately 6 feet apart.  The uncle was not coughing.  He had no symptoms at that time.  Saturday night, the uncle spiked a fever to 101.0.  Fever persisted until Monday.  On Monday he went for testing and tested positive for COVID 19.  Therefore they are 6 days from exposure.  They are completely asymptomatic.  She denies any body aches.  She denies any fever.  She denies any chills.  She denies any cough, shortness of breath, rhinorrhea, change in taste or smell, nausea, vomiting, diarrhea.  However she is questioning if she should be tested for COVID-19. Past Medical History:  Diagnosis Date  . Ankle fracture    hx of rt  . Arm fracture, left   . Carpal tunnel syndrome during pregnancy   . Headache   . Hx of pre-eclampsia in prior pregnancy, currently pregnant   . Panic attacks history of  . Pregnancy induced hypertension 2014   Past Surgical History:  Procedure Laterality Date  . APPENDECTOMY    . CESAREAN SECTION N/A 08/28/2013   Procedure: CESAREAN SECTION;  Surgeon: Princess Bruins, MD;  Location: [facility];  Service: Obstetrics;  Laterality: N/A;  . CESAREAN SECTION N/A 02/01/2019   Procedure: CESAREAN SECTION;  Surgeon: Brien Few, MD;  Location: [facility];  Service: Obstetrics;  Laterality: N/A;  . CHOLECYSTECTOMY    . tendonitis surgery  2006  . TONSILLECTOMY     Current Outpatient Medications on File Prior to  Visit  Medication Sig Dispense Refill  . acetaminophen (TYLENOL) 500 MG tablet Take 1,000 mg by mouth every 6 (six) hours as needed for moderate pain or headache.    Marland Kitchen aspirin 81 MG tablet Take 81 mg by mouth at bedtime.     . butalbital-acetaminophen-caffeine (FIORICET) 50-325-40 MG tablet Take by mouth 2 (two) times daily as needed for headache.    . calcium carbonate (TUMS - DOSED IN MG ELEMENTAL CALCIUM) 500 MG chewable tablet Chew 2 tablets by mouth 3 (three) times daily as needed for indigestion or heartburn.     . cetirizine (ZYRTEC) 10 MG tablet Take 10 mg by mouth daily.    . famotidine (PEPCID) 20 MG tablet Take 20 mg by mouth See admin instructions. Take 20 mg daily, may take an additional 20 mg dose as needed for heartburn    . fluticasone (FLONASE) 50 MCG/ACT nasal spray Place 1 spray into both nostrils daily as needed for allergies or rhinitis.    Marland Kitchen ibuprofen (ADVIL) 800 MG tablet Take 1 tablet (800 mg total) by mouth every 6 (six) hours. 30 tablet 0  . oxyCODONE (OXY IR/ROXICODONE) 5 MG immediate release tablet Take 1-2 tablets (5-10 mg total) by mouth every 4 (four) hours as needed for severe pain. 30 tablet 0  . Prenatal Vit-Fe Fumarate-FA (PRENATAL MULTIVITAMIN) TABS tablet Take 1 tablet by  mouth at bedtime.    . senna-docusate (SENOKOT-S) 8.6-50 MG tablet Take 2 tablets by mouth daily.    . simethicone (MYLICON) 80 MG chewable tablet Chew 1 tablet (80 mg total) by mouth as needed for flatulence. 30 tablet 0  . venlafaxine XR (EFFEXOR-XR) 75 MG 24 hr capsule Take 1 capsule (75 mg total) by mouth daily with breakfast. 90 capsule 3   No current facility-administered medications on file prior to visit.    Allergies  Allergen Reactions  . Adhesive [Tape] Rash    steristrips   Social History   Socioeconomic History  . Marital status: Married    Spouse name: Not on file  . Number of children: Not on file  . Years of education: Not on file  . Highest education level: Not on  file  Occupational History  . Not on file  Social Needs  . Financial resource strain: Not hard at all  . Food insecurity    Worry: Never true    Inability: Never true  . Transportation needs    Medical: No    Non-medical: Not on file  Tobacco Use  . Smoking status: Never Smoker  . Smokeless tobacco: Never Used  Substance and Sexual Activity  . Alcohol use: No    Comment: occ  . Drug use: No  . Sexual activity: Yes    Partners: Male    Comment: 1st intercourse- 54, partners- 1  Lifestyle  . Physical activity    Days per week: Not on file    Minutes per session: Not on file  . Stress: Only a little  Relationships  . Social Herbalist on phone: Not on file    Gets together: Not on file    Attends religious service: Not on file    Active member of club or organization: Not on file    Attends meetings of clubs or organizations: Not on file    Relationship status: Not on file  . Intimate partner violence    Fear of current or ex partner: No    Emotionally abused: No    Physically abused: No    Forced sexual activity: No  Other Topics Concern  . Not on file  Social History Narrative  . Not on file      Review of Systems  All other systems reviewed and are negative.      Objective:   Physical Exam  Physical exam cannot be performed today as patient was seen over the telephone.  However she is speaking full and complete sentences with no respiratory distress and is afebrile.      Assessment & Plan:  1. Close Exposure to Covid-19 Virus Patient has had close exposure to COVID-19.  She is asymptomatic.  Therefore the present time I have recommended against screening her for testing.  I have explained to the patient that she and her husband should quarantine at home for the next 8 days.  If they develop any symptoms I would get him tested immediately.  At the present time they are doing well with no symptoms so if they tested positive we would still recommend  self quarantine.  If they tested negative there is still the potential that they could test positive for up to a week later and developed symptoms up to a week from now.  Therefore I would still recommend that they self quarantine.  Therefore testing would not change protocol.  I explained this to the patient and her  husband and they seem comfortable with this plan.

## 2019-07-16 ENCOUNTER — Other Ambulatory Visit: Payer: Self-pay | Admitting: Family Medicine

## 2019-07-16 DIAGNOSIS — F410 Panic disorder [episodic paroxysmal anxiety] without agoraphobia: Secondary | ICD-10-CM

## 2019-11-08 IMAGING — US US MFM FETAL BPP WO NON STRESS
1 series · 13 of 28 positions shown · non-contrast
Comparison: none

[Series 1: us mfm fetal bpp wo non stress · 81 acquisitions, 13 frames shown]
[im 3/81]
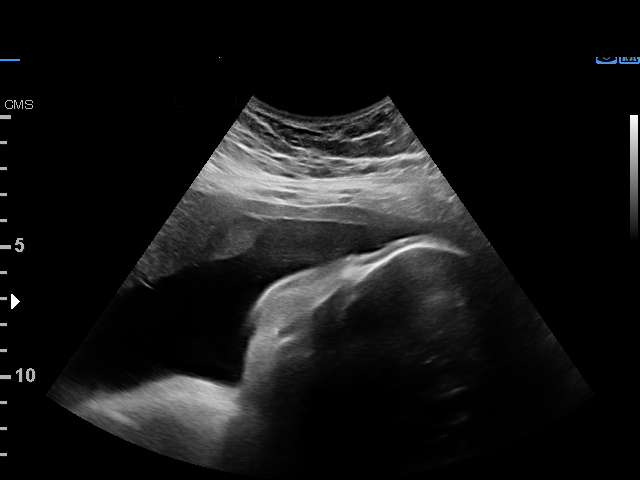
[im 9/81]
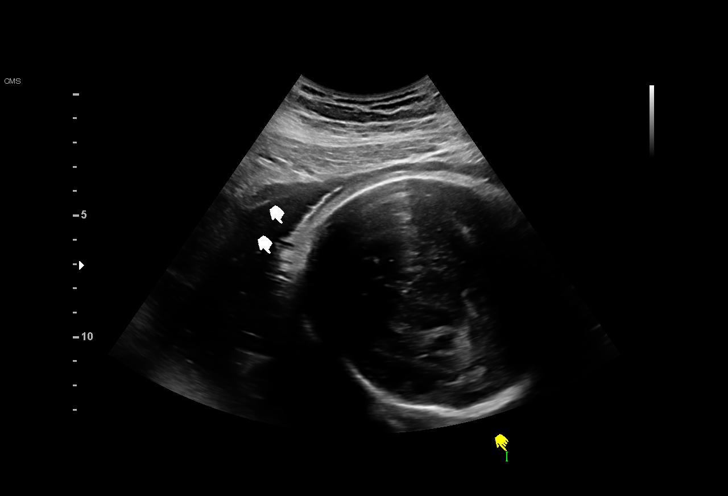
[im 15/81]
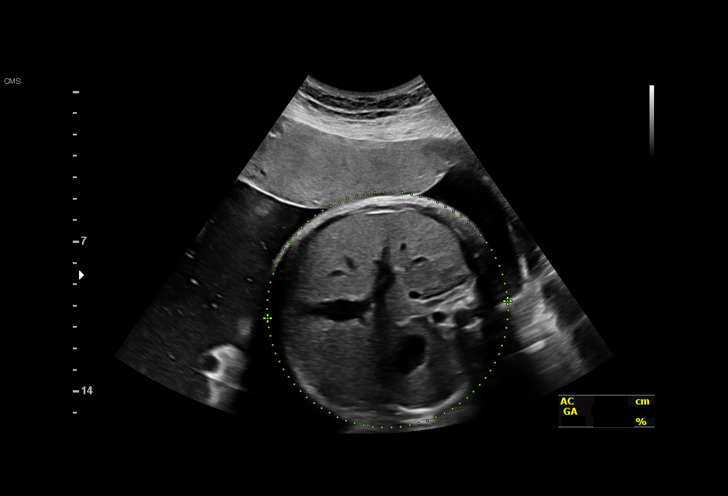
[im 21/81]
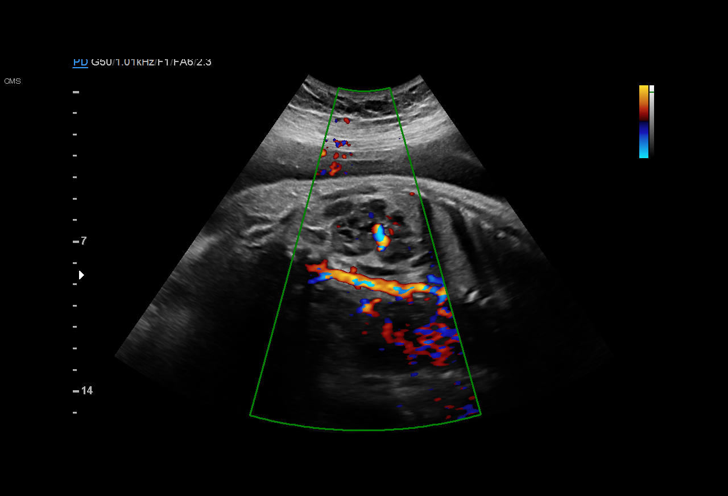
[im 27/81]
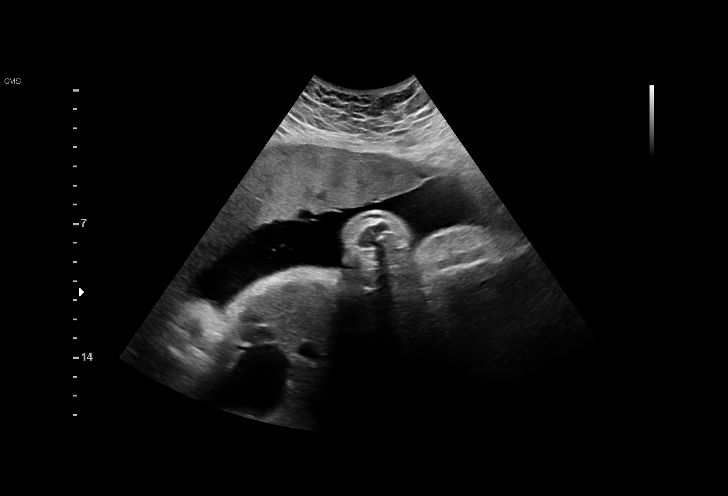
[im 33/81]
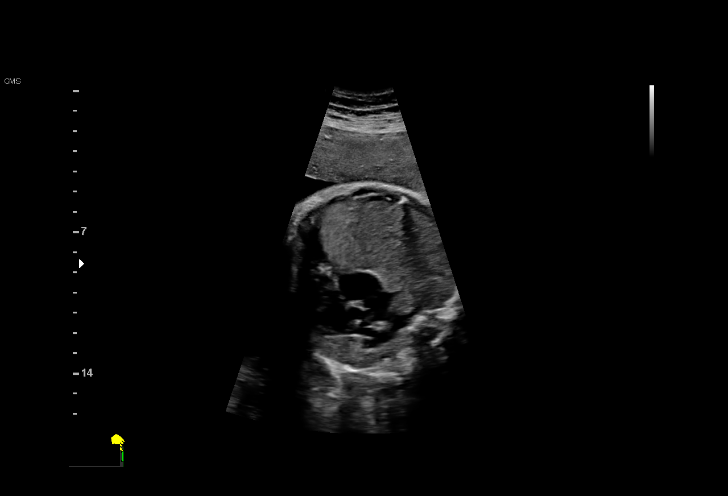
[im 42/81]
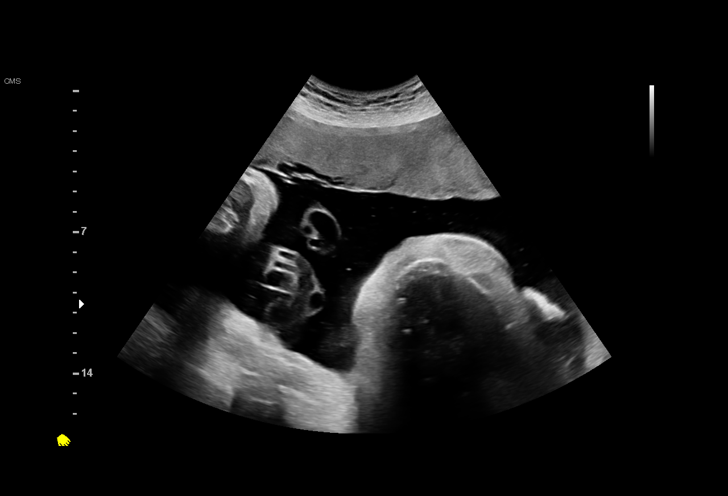
[im 48/81]
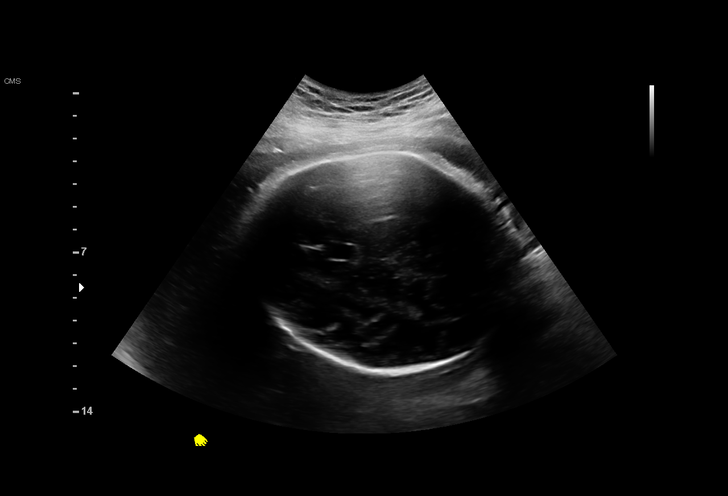
[im 54/81]
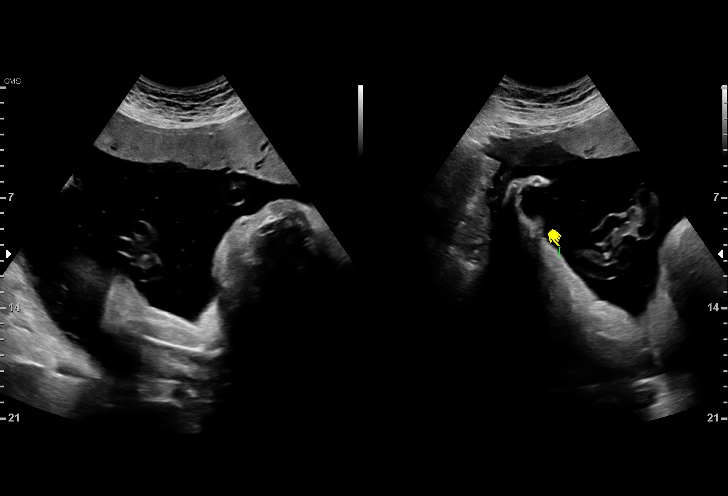
[im 60/81]
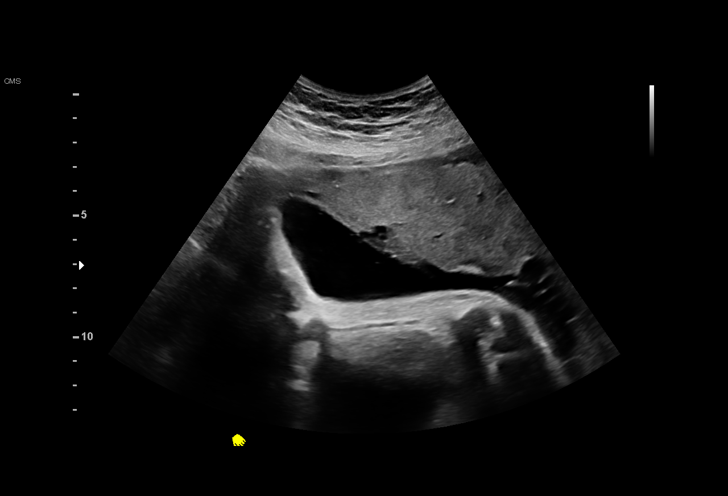
[im 66/81]
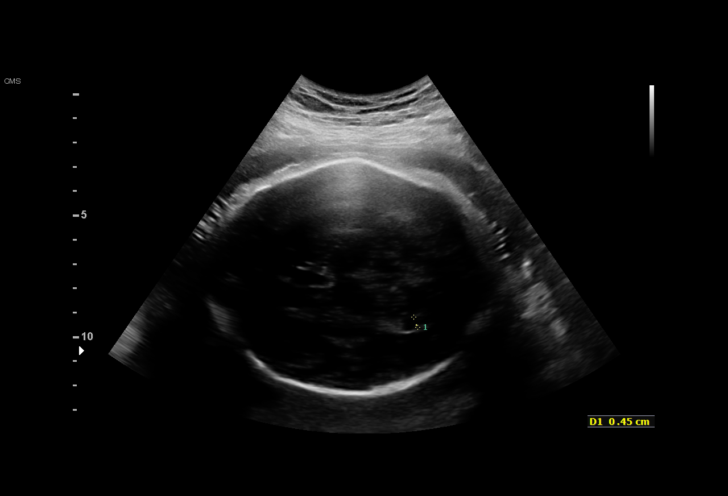
[im 72/81]
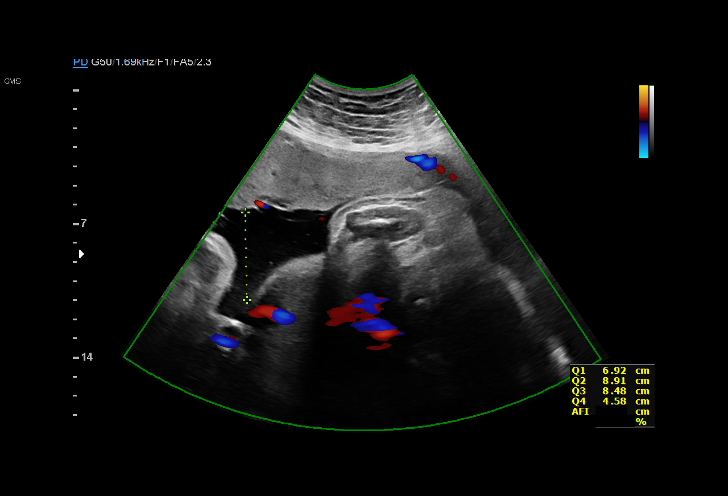
[im 78/81]
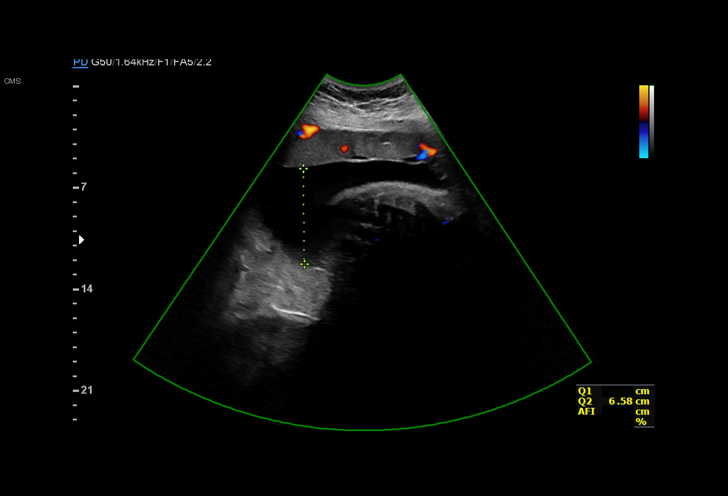

[13 of 28 positions shown; findings below may reference images not displayed]

& Infertility
                                                            2687 [REDACTED]

 ----------------------------------------------------------------------

 ----------------------------------------------------------------------
Indications

  Encounter for antenatal screening for
  malformations
  Polyhydramnios, third trimester, antepartum
  condition or complication, unspecified fetus
  Previous cesarean delivery, antepartum
  Obesity complicating pregnancy, third
  trimester
  Large for gestational age fetus affecting
  management of mother
  34 weeks gestation of pregnancy
 ----------------------------------------------------------------------
Fetal Evaluation

 Num Of Fetuses:         1
 Fetal Heart Rate(bpm):  136
 Cardiac Activity:       Observed
 Presentation:           Cephalic

 Amniotic Fluid
 AFI FV:      Polyhydramnios

 AFI Sum(cm)     %Tile       Largest Pocket(cm)
 30.52           > 97

 RUQ(cm)       RLQ(cm)       LUQ(cm)        LLQ(cm)

Biophysical Evaluation

 Amniotic F.V:   Polyhydramnios             F. Tone:        Observed
 F. Movement:    Observed                   Score:          [DATE]
 F. Breathing:   Observed
Biometry

 BPD:      97.3  mm     G. Age:  39w 6d       > 99  %    CI:        76.63   %    70 - 86
                                                         FL/HC:      20.2   %    20.1 -
 HC:      352.1  mm     G. Age:  41w 1d       > 97  %    HC/AC:      1.00        0.93 -
 AC:      353.4  mm     G. Age:  39w 2d       > 97  %    FL/BPD:     73.3   %    71 - 87
 FL:       71.3  mm     G. Age:  36w 4d         87  %    FL/AC:      20.2   %    20 - 24
 HUM:      59.8  mm     G. Age:  34w 5d         64  %
 CER:      49.6  mm     G. Age:  N/A          > 95  %

 Est. FW:    6120  gm           8 lb   > 90  %
OB History

 Gravidity:    3         Term:   1        Prem:   0        SAB:   1
 TOP:          0       Ectopic:  0        Living: 1
Gestational Age

 LMP:           35w 6d        Date:  04/30/18                 EDD:   02/04/19
 U/S Today:     39w 2d                                        EDD:   01/11/19
 Best:          34w 4d     Det. By:  Early Ultrasound         EDD:   02/13/19
                                     (06/24/18)
Anatomy

 Cranium:               Appears normal         LVOT:                   Appears normal
 Cavum:                 Appears normal         Aortic Arch:            Appears normal
 Ventricles:            Appears normal         Ductal Arch:            Not well visualized
 Choroid Plexus:        Appears normal         Diaphragm:              Appears normal
 Cerebellum:            Appears normal         Stomach:                Appears normal, left
                                                                       sided
 Posterior Fossa:       Not well visualized    Abdomen:                Appears normal
 Nuchal Fold:           Not applicable (>20    Abdominal Wall:         Appears nml (cord
                        wks GA)                                        insert, abd wall)
 Face:                  Appears normal         Cord Vessels:           Appears normal (3
                        (orbits and profile)                           vessel cord)
 Lips:                  Appears normal         Kidneys:                Appear normal
 Palate:                Appears normal         Bladder:                Appears normal
 Thoracic:              Appears normal         Spine:                  Appears normal
 Heart:                 Not well visualized    Upper Extremities:      Visualized
 RVOT:                  Appears normal         Lower Extremities:      Visualized

 Other:  Heels visualized.
Cervix Uterus Adnexa

 Cervix
 Not visualized (advanced GA >13wks)

 Uterus
 No abnormality visualized.
 Left Ovary
 Within normal limits.

 Right Ovary
 Within normal limits.

 Cul De Sac
 No free fluid seen.

 Adnexa
 No abnormality visualized.
Impression

 Ms. Tiger is here for a second opinion. On your office
 ultrasound, polyhydramnios is seen (AFI=30 cm). The
 estimated fetal weight is at greater than the 90th percentile.
 Abdominal circumference measures at greater than the 95th
 percentile. Fetal anatomy appears normal, but limited by
 advanced gestational age. Antenatal testing is reassuring.
 BPP [DATE].

 I counseled the patient on the findings that polyhydramnios is
 idiopathic (no cause could be determined) in most cases.
 However, it can be associated with fetal anomalies that may
 not be evident until postnatal evaluation.  Patient does not
 have gestational diabetes.

 Patient does not have symptoms of shortness of breath or
 backache.
Recommendations

 Recommend weekly antenatal testing till delivery that may be
 performed at your office.
                 Tuck, [place]

## 2019-12-19 ENCOUNTER — Ambulatory Visit (INDEPENDENT_AMBULATORY_CARE_PROVIDER_SITE_OTHER): Payer: BC Managed Care – PPO | Admitting: Family Medicine

## 2019-12-19 ENCOUNTER — Encounter: Payer: Self-pay | Admitting: Family Medicine

## 2019-12-19 VITALS — BP 122/78 | HR 88 | Temp 98.1°F | Resp 16 | Ht 66.0 in | Wt 277.0 lb

## 2019-12-19 DIAGNOSIS — R599 Enlarged lymph nodes, unspecified: Secondary | ICD-10-CM | POA: Diagnosis not present

## 2019-12-19 NOTE — Progress Notes (Signed)
Subjective:    Patient ID: [name], female    DOB: Feb 18, 1986, 34 y.o.   MRN: [id number]  HPI Last week, the patient noticed a tender nodule in her left axilla.  Her uncle had lymphoma and therefore the patient was very concerned that this may be an enlarged lymph node.  This prompted her to come today for evaluation.  The area is tender to palpation.  On exam, I am unable to appreciate any discernible nodularity in the left axilla.  There is no erythema.  There is no warmth.  There is no fluctuance.  There is no cellulitis.  There may be a small normal sized lymph node in the area that the patient reports feeling the tenderness in the nodule.  I suspect a reactive lymph node likely to shaving her axilla.  The patient denies any scratches or injuries to that arm however last week she did receive the Covid vaccination in the same arm. Past Medical History:  Diagnosis Date  . Ankle fracture    hx of rt  . Arm fracture, left   . Carpal tunnel syndrome during pregnancy   . Headache   . Hx of pre-eclampsia in prior pregnancy, currently pregnant   . Panic attacks history of  . Pregnancy induced hypertension 2014   Past Surgical History:  Procedure Laterality Date  . APPENDECTOMY    . CESAREAN SECTION N/A 08/28/2013   Procedure: CESAREAN SECTION;  Surgeon: Princess Bruins, MD;  Location: [facility];  Service: Obstetrics;  Laterality: N/A;  . CESAREAN SECTION N/A 02/01/2019   Procedure: CESAREAN SECTION;  Surgeon: Brien Few, MD;  Location: [facility];  Service: Obstetrics;  Laterality: N/A;  . CHOLECYSTECTOMY    . tendonitis surgery  2006  . TONSILLECTOMY     Current Outpatient Medications on File Prior to Visit  Medication Sig Dispense Refill  . aspirin 81 MG tablet Take 81 mg by mouth at bedtime.     . calcium carbonate (TUMS - DOSED IN MG ELEMENTAL CALCIUM) 500 MG chewable tablet Chew 2 tablets by mouth 3 (three) times daily as needed for indigestion or heartburn.     .  cetirizine (ZYRTEC) 10 MG tablet Take 10 mg by mouth daily.    . famotidine (PEPCID) 20 MG tablet Take 20 mg by mouth See admin instructions. Take 20 mg daily, may take an additional 20 mg dose as needed for heartburn    . fluticasone (FLONASE) 50 MCG/ACT nasal spray Place 1 spray into both nostrils daily as needed for allergies or rhinitis.    Marland Kitchen ibuprofen (ADVIL) 800 MG tablet Take 1 tablet (800 mg total) by mouth every 6 (six) hours. 30 tablet 0  . venlafaxine XR (EFFEXOR-XR) 75 MG 24 hr capsule TAKE (1) CAPSULE BY MOUTH EACH MORNING. 90 capsule 0   No current facility-administered medications on file prior to visit.   Allergies  Allergen Reactions  . Adhesive [Tape] Rash    steristrips   Social History   Socioeconomic History  . Marital status: Married    Spouse name: Not on file  . Number of children: Not on file  . Years of education: Not on file  . Highest education level: Not on file  Occupational History  . Not on file  Tobacco Use  . Smoking status: Never Smoker  . Smokeless tobacco: Never Used  Substance and Sexual Activity  . Alcohol use: No    Comment: occ  . Drug use: No  . Sexual activity:  Yes    Partners: Male    Comment: 1st intercourse- 20, partners- 1  Other Topics Concern  . Not on file  Social History Narrative  . Not on file   Social Determinants of Health   Financial Resource Strain: Low Risk   . Difficulty of Paying Living Expenses: Not hard at all  Food Insecurity: No Food Insecurity  . Worried About Charity fundraiser in the Last Year: Never true  . Ran Out of Food in the Last Year: Never true  Transportation Needs: Unknown  . Lack of Transportation (Medical): No  . Lack of Transportation (Non-Medical): Not on file  Physical Activity:   . Days of Exercise per Week:   . Minutes of Exercise per Session:   Stress: No Stress Concern Present  . Feeling of Stress : Only a little  Social Connections:   . Frequency of Communication with Friends  and Family:   . Frequency of Social Gatherings with Friends and Family:   . Attends Religious Services:   . Active Member of Clubs or Organizations:   . Attends Archivist Meetings:   Marland Kitchen Marital Status:   Intimate Partner Violence: Not At Risk  . Fear of Current or Ex-Partner: No  . Emotionally Abused: No  . Physically Abused: No  . Sexually Abused: No      Review of Systems  All other systems reviewed and are negative.      Objective:   Physical Exam Constitutional:      Appearance: Normal appearance.  Cardiovascular:     Rate and Rhythm: Normal rate and regular rhythm.     Heart sounds: Normal heart sounds.  Pulmonary:     Effort: Pulmonary effort is normal.     Breath sounds: Normal breath sounds.  Abdominal:     Palpations: Abdomen is soft. There is no hepatomegaly, splenomegaly or mass.  Lymphadenopathy:     Head:     Right side of head: No submental, submandibular, preauricular, posterior auricular or occipital adenopathy.     Left side of head: No submental, submandibular, preauricular, posterior auricular or occipital adenopathy.     Cervical: No cervical adenopathy.     Right cervical: No superficial, deep or posterior cervical adenopathy.    Left cervical: No superficial, deep or posterior cervical adenopathy.     Upper Body:     Right upper body: No supraclavicular or axillary adenopathy.     Left upper body: Axillary adenopathy present. No supraclavicular adenopathy.     Lower Body: No right inguinal adenopathy. No left inguinal adenopathy.  Neurological:     Mental Status: She is alert.           Assessment & Plan:  Reactive lymphadenopathy  I suspect that this is a reactive lymph node either Covid vaccination she recently received deltoid or perhaps some irritation from shaving.  I do not appreciate any discernible mass other than what appears to be a normal size lymph node in the left axilla.  There is no evidence of any fluctuance or  abscess or cellulitis.  Therefore I recommended that we monitor this area.  If enlarging, we could obtain a ultrasound of the left axilla to evaluate further however I suspect that the lymphadenopathy will pass over the next 2 to 3 weeks with no further complications.

## 2020-01-09 ENCOUNTER — Other Ambulatory Visit: Payer: Self-pay | Admitting: Family Medicine

## 2020-01-09 DIAGNOSIS — F410 Panic disorder [episodic paroxysmal anxiety] without agoraphobia: Secondary | ICD-10-CM

## 2020-09-25 ENCOUNTER — Encounter: Payer: Self-pay | Admitting: Family Medicine

## 2020-09-26 ENCOUNTER — Other Ambulatory Visit: Payer: Self-pay | Admitting: Family Medicine

## 2020-09-26 DIAGNOSIS — F410 Panic disorder [episodic paroxysmal anxiety] without agoraphobia: Secondary | ICD-10-CM

## 2020-10-24 ENCOUNTER — Encounter: Payer: Self-pay | Admitting: Emergency Medicine

## 2020-10-24 ENCOUNTER — Other Ambulatory Visit: Payer: Self-pay

## 2020-10-24 ENCOUNTER — Ambulatory Visit
Admission: EM | Admit: 2020-10-24 | Discharge: 2020-10-24 | Disposition: A | Discharge status: Home / Self Care | Payer: BC Managed Care – PPO | Attending: Emergency Medicine | Admitting: Emergency Medicine

## 2020-10-24 DIAGNOSIS — J029 Acute pharyngitis, unspecified: Secondary | ICD-10-CM | POA: Diagnosis not present

## 2020-10-24 DIAGNOSIS — Z1152 Encounter for screening for COVID-19: Secondary | ICD-10-CM | POA: Diagnosis not present

## 2020-10-24 DIAGNOSIS — Z20822 Contact with and (suspected) exposure to covid-19: Secondary | ICD-10-CM

## 2020-10-24 LAB — POCT RAPID STREP A (OFFICE): Rapid Strep A Screen: NEGATIVE

## 2020-10-24 MED ORDER — PREDNISONE 10 MG PO TABS: 20.0000 mg | ORAL_TABLET | Freq: Every day | ORAL | 0 refills | Status: DC

## 2020-10-24 MED ORDER — LIDOCAINE VISCOUS HCL 2 % MT SOLN: 15.0000 mL | OROMUCOSAL | 1 refills | Status: DC | PRN

## 2020-10-24 NOTE — ED Triage Notes (Signed)
Sore throat since Saturday.  Wants to be covid tested.

## 2020-10-24 NOTE — Discharge Instructions (Signed)
POCT strep test is negative  COVID testing ordered.  It will take between 2-7 days for test results.  Someone will contact you regarding abnormal results.    Get plenty of rest and push fluids Lidocaine mouthwash was prescribed for sore throat Prednisone was prescribed Continue Zyrtec and Flonase as prescribed and directed by PCP Use OTC throat lozenges such as Halls, Cepacol or Vicks to soothe throat Use medications daily for symptom relief Use OTC medications like ibuprofen or tylenol as needed fever or pain Call or go to the ED if you have any new or worsening symptoms such as fever, worsening cough, shortness of breath, chest tightness, chest pain, turning blue, changes in mental status, etc..Marland Kitchen

## 2020-10-24 NOTE — ED Provider Notes (Signed)
[place]   [id number] 10/24/20 Arrival Time: 33   CC: COVID symptoms  SUBJECTIVE: History from: patient.  [name] is a 35 y.o. female who presented to the urgent care with a complaint of sore throat for the past 4 days.  Denies sick exposure to COVID, flu or strep.  Denies recent travel.  Has tried OTC medication without relief.  Denies alleviating or aggravating factors.  Denies previous symptoms in the past.   Denies fever, chills, fatigue, sinus pain, rhinorrhea, SOB, wheezing, chest pain, nausea, changes in bowel or bladder habits.     ROS: As per HPI.  All other pertinent ROS negative.      Past Medical History:  Diagnosis Date  . Ankle fracture    hx of rt  . Arm fracture, left   . Carpal tunnel syndrome during pregnancy   . Headache   . Hx of pre-eclampsia in prior pregnancy, currently pregnant   . Panic attacks history of  . Pregnancy induced hypertension 2014   Past Surgical History:  Procedure Laterality Date  . APPENDECTOMY    . CESAREAN SECTION N/A 08/28/2013   Procedure: CESAREAN SECTION;  Surgeon: Princess Bruins, MD;  Location: [facility];  Service: Obstetrics;  Laterality: N/A;  . CESAREAN SECTION N/A 02/01/2019   Procedure: CESAREAN SECTION;  Surgeon: Brien Few, MD;  Location: [facility];  Service: Obstetrics;  Laterality: N/A;  . CHOLECYSTECTOMY    . tendonitis surgery  2006  . TONSILLECTOMY     Allergies  Allergen Reactions  . Adhesive [Tape] Rash    steristrips   No current facility-administered medications on file prior to encounter.   Current Outpatient Medications on File Prior to Encounter  Medication Sig Dispense Refill  . aspirin 81 MG tablet Take 81 mg by mouth at bedtime.     . calcium carbonate (TUMS - DOSED IN MG ELEMENTAL CALCIUM) 500 MG chewable tablet Chew 2 tablets by mouth 3 (three) times daily as needed for indigestion or heartburn.     . cetirizine (ZYRTEC) 10 MG tablet Take 10 mg by mouth daily.    .  famotidine (PEPCID) 20 MG tablet Take 20 mg by mouth See admin instructions. Take 20 mg daily, may take an additional 20 mg dose as needed for heartburn    . fluticasone (FLONASE) 50 MCG/ACT nasal spray Place 1 spray into both nostrils daily as needed for allergies or rhinitis.    Marland Kitchen ibuprofen (ADVIL) 800 MG tablet Take 1 tablet (800 mg total) by mouth every 6 (six) hours. 30 tablet 0  . venlafaxine XR (EFFEXOR-XR) 75 MG 24 hr capsule TAKE (1) CAPSULE BY MOUTH EACH MORNING. 90 capsule 0   Social History   Socioeconomic History  . Marital status: Married    Spouse name: Not on file  . Number of children: Not on file  . Years of education: Not on file  . Highest education level: Not on file  Occupational History  . Not on file  Tobacco Use  . Smoking status: Never Smoker  . Smokeless tobacco: Never Used  Vaping Use  . Vaping Use: Never used  Substance and Sexual Activity  . Alcohol use: No    Comment: occ  . Drug use: No  . Sexual activity: Yes    Partners: Male    Comment: 1st intercourse- 1, partners- 1  Other Topics Concern  . Not on file  Social History Narrative  . Not on file   Social Determinants of Health  Financial Resource Strain: Not on file  Food Insecurity: Not on file  Transportation Needs: Not on file  Physical Activity: Not on file  Stress: Not on file  Social Connections: Not on file  Intimate Partner Violence: Not on file   Family History  Problem Relation Age of Onset  . Stroke Paternal Grandmother   . Cancer Maternal Uncle        leukemia  . Cancer Maternal Grandfather        leukemia  . Heart attack Maternal Grandfather     OBJECTIVE:  Vitals:   10/24/20 1137  BP: 121/87  Pulse: 85  Resp: 18  Temp: 97.8 F (36.6 C)  TempSrc: Oral  SpO2: 94%  Weight: 275 lb (124.7 kg)  Height: 5\' 6"  (1.676 m)     General appearance: alert; appears fatigued, but nontoxic; speaking in full sentences and tolerating own secretions HEENT: NCAT; Ears:  EACs clear, TMs pearly gray; Eyes: PERRL.  EOM grossly intact. Sinuses: nontender; Nose: nares patent without rhinorrhea, Throat: oropharynx clear, tonsils non erythematous or enlarged, uvula midline  Neck: supple without LAD Lungs: unlabored respirations, symmetrical air entry; cough: none; no respiratory distress; CTAB Heart: regular rate and rhythm.  Radial pulses 2+ symmetrical bilaterally Skin: warm and dry Psychological: alert and cooperative; normal mood and affect  LABS:  Results for orders placed or performed during the hospital encounter of 10/24/20 (from the past 24 hour(s))  POCT rapid strep A     Status: None   Collection Time: 10/24/20 11:40 AM  Result Value Ref Range   Rapid Strep A Screen Negative Negative     ASSESSMENT & PLAN:  1. Encounter for screening for COVID-19   2. Sore throat     Meds ordered this encounter  Medications  . lidocaine (XYLOCAINE) 2 % solution    Sig: Use as directed 15 mLs in the mouth or throat as needed for mouth pain.    Dispense:  100 mL    Refill:  1  . predniSONE (DELTASONE) 10 MG tablet    Sig: Take 2 tablets (20 mg total) by mouth daily.    Dispense:  15 tablet    Refill:  0    Discharge instructions  POCT strep test is negative  COVID testing ordered.  It will take between 2-7 days for test results.  Someone will contact you regarding abnormal results.    Get plenty of rest and push fluids Lidocaine mouthwash was prescribed for sore throat Prednisone was prescribed Continue Zyrtec and Flonase as prescribed and directed by PCP Use OTC throat lozenges such as Halls, Cepacol or Vicks to soothe throat Use medications daily for symptom relief Use OTC medications like ibuprofen or tylenol as needed fever or pain Call or go to the ED if you have any new or worsening symptoms such as fever, worsening cough, shortness of breath, chest tightness, chest pain, turning blue, changes in mental status, etc...   Reviewed expectations  re: course of current medical issues. Questions answered. Outlined signs and symptoms indicating need for more acute intervention. Patient verbalized understanding. After Visit Summary given.         Emerson Monte, [place] 10/24/20 1303

## 2020-10-26 ENCOUNTER — Ambulatory Visit: Payer: BC Managed Care – PPO | Admitting: Family Medicine

## 2020-10-26 LAB — COVID-19, FLU A+B NAA
Influenza A, NAA: NOT DETECTED
Influenza B, NAA: NOT DETECTED
SARS-CoV-2, NAA: NOT DETECTED

## 2021-01-06 ENCOUNTER — Other Ambulatory Visit: Payer: Self-pay | Admitting: Family Medicine

## 2021-01-06 DIAGNOSIS — F410 Panic disorder [episodic paroxysmal anxiety] without agoraphobia: Secondary | ICD-10-CM

## 2021-01-07 ENCOUNTER — Other Ambulatory Visit: Payer: Self-pay | Admitting: Family Medicine

## 2021-01-07 ENCOUNTER — Other Ambulatory Visit: Payer: Self-pay | Admitting: *Deleted

## 2021-01-07 DIAGNOSIS — F410 Panic disorder [episodic paroxysmal anxiety] without agoraphobia: Secondary | ICD-10-CM

## 2021-01-07 MED ORDER — VENLAFAXINE HCL ER 75 MG PO CP24: ORAL_CAPSULE | ORAL | 0 refills | Status: DC

## 2021-01-10 ENCOUNTER — Encounter: Payer: Self-pay | Admitting: Family Medicine

## 2021-01-10 ENCOUNTER — Ambulatory Visit: Payer: BC Managed Care – PPO | Admitting: Family Medicine

## 2021-01-10 ENCOUNTER — Other Ambulatory Visit: Payer: Self-pay

## 2021-01-10 DIAGNOSIS — F411 Generalized anxiety disorder: Secondary | ICD-10-CM | POA: Diagnosis not present

## 2021-01-10 DIAGNOSIS — Z6841 Body Mass Index (BMI) 40.0 and over, adult: Secondary | ICD-10-CM

## 2021-01-10 DIAGNOSIS — F410 Panic disorder [episodic paroxysmal anxiety] without agoraphobia: Secondary | ICD-10-CM | POA: Diagnosis not present

## 2021-01-10 LAB — CBC WITH DIFFERENTIAL/PLATELET: Total Lymphocyte: 48.9 %

## 2021-01-10 MED ORDER — OZEMPIC (0.25 OR 0.5 MG/DOSE) 2 MG/1.5ML ~~LOC~~ SOPN: 0.2500 mg | PEN_INJECTOR | SUBCUTANEOUS | 3 refills | Status: DC

## 2021-01-10 MED ORDER — VENLAFAXINE HCL ER 75 MG PO CP24: ORAL_CAPSULE | ORAL | 3 refills | Status: DC

## 2021-01-10 NOTE — Progress Notes (Signed)
Subjective:    Patient ID: [name], female    DOB: Mar 05, 1986, 35 y.o.   MRN: [id number]  HPI Patient is a very pleasant 35 year old Caucasian female here today for a checkup.  She primarily made the appointment so that she can get a refill on the venlafaxine.  She has been on this medication for quite some time.  She feels like the medication does help as it helps to prevent anxiety and helps to calm her down and make her relax.  She does not want to wean off the medication at the present time.  However she is interested in assistance at lowering her BMI.  She has made lifestyle changes and is going to the gym frequently and working on diet however has not seen any success.  She is also complaining of joint pain and therefore her BMI is affecting her joints and likely leading to degenerative joint disease.  We discussed several options today including Adipex, Topamax, and Ozempic.  If covered by her insurance she would like to try Ozempic.  She has a history of a tubal ligation.  Therefore unintended pregnancy would not be a concern regarding the medication. Past Medical History:  Diagnosis Date  . Ankle fracture    hx of rt  . Arm fracture, left   . Carpal tunnel syndrome during pregnancy   . Headache   . Hx of pre-eclampsia in prior pregnancy, currently pregnant   . Panic attacks history of  . Pregnancy induced hypertension 2014   Past Surgical History:  Procedure Laterality Date  . APPENDECTOMY    . CESAREAN SECTION N/A 08/28/2013   Procedure: CESAREAN SECTION;  Surgeon: Princess Bruins, MD;  Location: [facility];  Service: Obstetrics;  Laterality: N/A;  . CESAREAN SECTION N/A 02/01/2019   Procedure: CESAREAN SECTION;  Surgeon: Brien Few, MD;  Location: [facility];  Service: Obstetrics;  Laterality: N/A;  . CHOLECYSTECTOMY    . tendonitis surgery  2006  . TONSILLECTOMY     Current Outpatient Medications on File Prior to Visit  Medication Sig Dispense Refill  .  cetirizine (ZYRTEC) 10 MG tablet Take 10 mg by mouth daily.    . famotidine (PEPCID) 20 MG tablet Take 20 mg by mouth See admin instructions. Take 20 mg daily, may take an additional 20 mg dose as needed for heartburn    . fluticasone (FLONASE) 50 MCG/ACT nasal spray Place 1 spray into both nostrils daily as needed for allergies or rhinitis.    Marland Kitchen ibuprofen (ADVIL) 800 MG tablet Take 1 tablet (800 mg total) by mouth every 6 (six) hours. 30 tablet 0   No current facility-administered medications on file prior to visit.   Allergies  Allergen Reactions  . Adhesive [Tape] Rash    steristrips   Social History   Socioeconomic History  . Marital status: Married    Spouse name: Not on file  . Number of children: Not on file  . Years of education: Not on file  . Highest education level: Not on file  Occupational History  . Not on file  Tobacco Use  . Smoking status: Never Smoker  . Smokeless tobacco: Never Used  Vaping Use  . Vaping Use: Never used  Substance and Sexual Activity  . Alcohol use: No    Comment: occ  . Drug use: No  . Sexual activity: Yes    Partners: Male    Comment: 1st intercourse- 22, partners- 1  Other Topics Concern  . Not on  file  Social History Narrative  . Not on file   Social Determinants of Health   Financial Resource Strain: Not on file  Food Insecurity: Not on file  Transportation Needs: Not on file  Physical Activity: Not on file  Stress: Not on file  Social Connections: Not on file  Intimate Partner Violence: Not on file      Review of Systems  All other systems reviewed and are negative.      Objective:   Physical Exam Constitutional:      Appearance: Normal appearance.  Cardiovascular:     Rate and Rhythm: Normal rate and regular rhythm.     Heart sounds: Normal heart sounds.  Pulmonary:     Effort: Pulmonary effort is normal.     Breath sounds: Normal breath sounds.  Abdominal:     Palpations: Abdomen is soft. There is no  hepatomegaly, splenomegaly or mass.  Lymphadenopathy:     Cervical: No cervical adenopathy.     Lower Body: No right inguinal adenopathy. No left inguinal adenopathy.  Neurological:     Mental Status: She is alert.           Assessment & Plan:  Class 3 severe obesity with serious comorbidity and body mass index (BMI) of 40.0 to 44.9 in adult, unspecified obesity type ([place]) - Plan: CBC with Differential/Platelet, COMPLETE METABOLIC PANEL WITH GFR  Generalized anxiety disorder with panic attacks - Plan: venlafaxine XR (EFFEXOR-XR) 75 MG 24 hr capsule Patient still sees benefit from venlafaxine and prefers to leave it alone and not make any adjustments to the dose.  Therefore I will be glad to give her a year supply at the current strength.  However we will try her on Ozempic 0.25 mg subcu weekly.  In 4 weeks I would increase to 0.5 mg if tolerated.  Recheck BMI in 6 weeks for insurance purposes and then every 6 months thereafter.  If insurance will not cover Ozempic, we could try Adipex instead.  Patient is encouraged to continue lifestyle changes.  Meanwhile check CBC and CMP and I would like the patient to return fasting for lipid panel at her convenience. I suspect that this is a reactive lymph node either Covid vaccination she recently received deltoid or perhaps some irritation from shaving.  I do not appreciate any discernible mass other than what appears to be a normal size lymph node in the left axilla.  There is no evidence of any fluctuance or abscess or cellulitis.  Therefore I recommended that we monitor this area.  If enlarging, we could obtain a ultrasound of the left axilla to evaluate further however I suspect that the lymphadenopathy will pass over the next 2 to 3 weeks with no further complications.

## 2021-01-11 LAB — COMPLETE METABOLIC PANEL WITH GFR
AG Ratio: 1.4 (calc) (ref 1.0–2.5)
ALT: 28 U/L (ref 6–29)
AST: 19 U/L (ref 10–30)
Albumin: 4.2 g/dL (ref 3.6–5.1)
Alkaline phosphatase (APISO): 74 U/L (ref 31–125)
BUN: 11 mg/dL (ref 7–25)
CO2: 24 mmol/L (ref 20–32)
Calcium: 9.2 mg/dL (ref 8.6–10.2)
Chloride: 107 mmol/L (ref 98–110)
Creat: 0.77 mg/dL (ref 0.50–1.10)
GFR, Est African American: 116 mL/min/{1.73_m2} (ref >=60)
GFR, Est Non African American: 100 mL/min/{1.73_m2} (ref >=60)
Globulin: 3 g/dL (calc) (ref 1.9–3.7)
Glucose, Bld: 97 mg/dL (ref 65–99)
Potassium: 4.6 mmol/L (ref 3.5–5.3)
Sodium: 140 mmol/L (ref 135–146)
Total Bilirubin: 0.3 mg/dL (ref 0.2–1.2)
Total Protein: 7.2 g/dL (ref 6.1–8.1)

## 2021-01-11 LAB — CBC WITH DIFFERENTIAL/PLATELET
Absolute Monocytes: 563 cells/uL (ref 200–950)
Basophils Absolute: 53 cells/uL (ref 0–200)
Basophils Relative: 0.7 %
Eosinophils Absolute: 180 cells/uL (ref 15–500)
Eosinophils Relative: 2.4 %
HCT: 38.9 % (ref 35.0–45.0)
Hemoglobin: 13 g/dL (ref 11.7–15.5)
Lymphs Abs: 3668 cells/uL (ref 850–3900)
MCH: 28.6 pg (ref 27.0–33.0)
MCHC: 33.4 g/dL (ref 32.0–36.0)
MCV: 85.5 fL (ref 80.0–100.0)
MPV: 9.5 fL (ref 7.5–12.5)
Monocytes Relative: 7.5 %
Neutro Abs: 3038 cells/uL (ref 1500–7800)
Neutrophils Relative %: 40.5 %
Platelets: 289 10*3/uL (ref 140–400)
RBC: 4.55 10*6/uL (ref 3.80–5.10)
RDW: 14 % (ref 11.0–15.0)
WBC: 7.5 10*3/uL (ref 3.8–10.8)

## 2021-02-21 ENCOUNTER — Encounter: Payer: Self-pay | Admitting: Family Medicine

## 2021-02-21 ENCOUNTER — Other Ambulatory Visit: Payer: Self-pay

## 2021-02-21 ENCOUNTER — Ambulatory Visit: Payer: BC Managed Care – PPO | Admitting: Family Medicine

## 2021-02-21 VITALS — BP 110/60 | HR 78 | Temp 98.2°F | Resp 14 | Ht 64.0 in | Wt 267.0 lb

## 2021-02-21 DIAGNOSIS — Z6841 Body Mass Index (BMI) 40.0 and over, adult: Secondary | ICD-10-CM | POA: Diagnosis not present

## 2021-02-21 MED ORDER — OZEMPIC (1 MG/DOSE) 2 MG/1.5ML ~~LOC~~ SOPN: 1.0000 mg | PEN_INJECTOR | SUBCUTANEOUS | 3 refills | Status: DC

## 2021-02-21 NOTE — Progress Notes (Signed)
Subjective:    Patient ID: [name], female    DOB: 04-09-1986, 35 y.o.   MRN: [id number]  HPI  01/10/21 Patient is a very pleasant 35 year old Caucasian female here today for a checkup.  She primarily made the appointment so that she can get a refill on the venlafaxine.  She has been on this medication for quite some time.  She feels like the medication does help as it helps to prevent anxiety and helps to calm her down and make her relax.  She does not want to wean off the medication at the present time.  However she is interested in assistance at lowering her BMI.  She has made lifestyle changes and is going to the gym frequently and working on diet however has not seen any success.  She is also complaining of joint pain and therefore her BMI is affecting her joints and likely leading to degenerative joint disease.  We discussed several options today including Adipex, Topamax, and Ozempic.  If covered by her insurance she would like to try Ozempic.  She has a history of a tubal ligation.  Therefore unintended pregnancy would not be a concern regarding the medication.  At that time, my plan was: Patient still sees benefit from venlafaxine and prefers to leave it alone and not make any adjustments to the dose.  Therefore I will be glad to give her a year supply at the current strength.  However we will try her on Ozempic 0.25 mg subcu weekly.  In 4 weeks I would increase to 0.5 mg if tolerated.  Recheck BMI in 6 weeks for insurance purposes and then every 6 months thereafter.  If insurance will not cover Ozempic, we could try Adipex instead.  Patient is encouraged to continue lifestyle changes.  Meanwhile check CBC and CMP and I would like the patient to return fasting for lipid panel at her convenience. I suspect that this is a reactive lymph node either Covid vaccination she recently received deltoid or perhaps some irritation from shaving.  I do not appreciate any discernible mass other than what  appears to be a normal size lymph node in the left axilla.  There is no evidence of any fluctuance or abscess or cellulitis.  Therefore I recommended that we monitor this area.  If enlarging, we could obtain a ultrasound of the left axilla to evaluate further however I suspect that the lymphadenopathy will pass over the next 2 to 3 weeks with no further complications. Wt Readings from Last 3 Encounters:  02/21/21 267 lb (121.1 kg)  01/10/21 276 lb (125.2 kg)  10/24/20 275 lb (124.7 kg)  Patient has lost 9 pounds since her last visit!  I congratulated her on this.  She is on 0.5 mg of Ozempic.  She states that this will be her third dose today.  She has tolerated it thus far.  She states she occasionally has nausea if she overeats.  However if she limits her intake, the medicine is not causing her any side effects.  She would like to increase the dose.  Past Medical History:  Diagnosis Date  . Ankle fracture    hx of rt  . Arm fracture, left   . Carpal tunnel syndrome during pregnancy   . Headache   . Hx of pre-eclampsia in prior pregnancy, currently pregnant   . Panic attacks history of  . Pregnancy induced hypertension 2014   Past Surgical History:  Procedure Laterality Date  . APPENDECTOMY    .  CESAREAN SECTION N/A 08/28/2013   Procedure: CESAREAN SECTION;  Surgeon: Princess Bruins, MD;  Location: [facility];  Service: Obstetrics;  Laterality: N/A;  . CESAREAN SECTION N/A 02/01/2019   Procedure: CESAREAN SECTION;  Surgeon: Brien Few, MD;  Location: [facility];  Service: Obstetrics;  Laterality: N/A;  . CHOLECYSTECTOMY    . tendonitis surgery  2006  . TONSILLECTOMY     Current Outpatient Medications on File Prior to Visit  Medication Sig Dispense Refill  . cetirizine (ZYRTEC) 10 MG tablet Take 10 mg by mouth daily.    . famotidine (PEPCID) 20 MG tablet Take 20 mg by mouth See admin instructions. Take 20 mg daily, may take an additional 20 mg dose as needed for heartburn    .  fluticasone (FLONASE) 50 MCG/ACT nasal spray Place 1 spray into both nostrils daily as needed for allergies or rhinitis.    Marland Kitchen ibuprofen (ADVIL) 800 MG tablet Take 1 tablet (800 mg total) by mouth every 6 (six) hours. 30 tablet 0  . Semaglutide,0.25 or 0.5MG /DOS, (OZEMPIC, 0.25 OR 0.5 MG/DOSE,) 2 MG/1.5ML SOPN Inject 0.25 mg into the skin once a week. 1.5 mL 3  . venlafaxine XR (EFFEXOR-XR) 75 MG 24 hr capsule TAKE (1) CAPSULE BY MOUTH EACH MORNING. 90 capsule 3   No current facility-administered medications on file prior to visit.   Allergies  Allergen Reactions  . Adhesive [Tape] Rash    steristrips   Social History   Socioeconomic History  . Marital status: Married    Spouse name: Not on file  . Number of children: Not on file  . Years of education: Not on file  . Highest education level: Not on file  Occupational History  . Not on file  Tobacco Use  . Smoking status: Never Smoker  . Smokeless tobacco: Never Used  Vaping Use  . Vaping Use: Never used  Substance and Sexual Activity  . Alcohol use: No    Comment: occ  . Drug use: No  . Sexual activity: Yes    Partners: Male    Comment: 1st intercourse- 2, partners- 1  Other Topics Concern  . Not on file  Social History Narrative  . Not on file   Social Determinants of Health   Financial Resource Strain: Not on file  Food Insecurity: Not on file  Transportation Needs: Not on file  Physical Activity: Not on file  Stress: Not on file  Social Connections: Not on file  Intimate Partner Violence: Not on file      Review of Systems  All other systems reviewed and are negative.      Objective:   Physical Exam Constitutional:      Appearance: Normal appearance.  Cardiovascular:     Rate and Rhythm: Normal rate and regular rhythm.     Heart sounds: Normal heart sounds.  Pulmonary:     Effort: Pulmonary effort is normal.     Breath sounds: Normal breath sounds.  Abdominal:     Palpations: Abdomen is soft.  There is no hepatomegaly, splenomegaly or mass.  Lymphadenopathy:     Cervical: No cervical adenopathy.     Lower Body: No right inguinal adenopathy. No left inguinal adenopathy.  Neurological:     Mental Status: She is alert.           Assessment & Plan:  Class 3 severe obesity with serious comorbidity and body mass index (BMI) of 40.0 to 44.9 in adult, unspecified obesity type ([place])  Increase Ozempic  to 1 mg a week.  Reassess in 1 month.  Increase dose as tolerated to maximum on a monthly basis.  Recheck in 6 months to recheck BMI

## 2021-03-20 ENCOUNTER — Encounter: Payer: Self-pay | Admitting: Family Medicine

## 2021-03-21 MED ORDER — OZEMPIC (2 MG/DOSE) 8 MG/3ML ~~LOC~~ SOPN: 2.0000 mg | PEN_INJECTOR | SUBCUTANEOUS | 1 refills | Status: DC

## 2021-03-25 ENCOUNTER — Telehealth: Payer: Self-pay | Admitting: *Deleted

## 2021-03-25 MED ORDER — OZEMPIC (1 MG/DOSE) 2 MG/1.5ML ~~LOC~~ SOPN: 1.0000 mg | PEN_INJECTOR | SUBCUTANEOUS | 3 refills | Status: DC

## 2021-03-25 MED ORDER — OZEMPIC (2 MG/DOSE) 8 MG/3ML ~~LOC~~ SOPN: 2.0000 mg | PEN_INJECTOR | SUBCUTANEOUS | 1 refills | Status: DC

## 2021-03-25 NOTE — Telephone Encounter (Signed)
Your information has been submitted to [facility]. To check for an updated outcome later, reopen this PA request from your dashboard.  If [employer] has not responded to your request within 24 hours, contact [facility] at [phone]

## 2021-03-25 NOTE — Telephone Encounter (Signed)
Received PA determination.   PA approved 03/25/2021 - 03/25/2022  Call placed to patient and patient made aware.

## 2021-03-25 NOTE — Telephone Encounter (Signed)
Received request from pharmacy for PA on Ozempic 2mg .  PA submitted.   Dx: E11.9- DM, E66.09- obesity,.

## 2021-04-05 ENCOUNTER — Encounter: Payer: Self-pay | Admitting: Family Medicine

## 2021-04-09 MED ORDER — SEMAGLUTIDE (1 MG/DOSE) 4 MG/3ML ~~LOC~~ SOPN: 1.0000 mg | PEN_INJECTOR | SUBCUTANEOUS | 11 refills | Status: DC

## 2021-04-17 MED ORDER — OZEMPIC (2 MG/DOSE) 8 MG/3ML ~~LOC~~ SOPN: 2.0000 mg | PEN_INJECTOR | SUBCUTANEOUS | 1 refills | Status: DC

## 2021-07-02 ENCOUNTER — Encounter: Payer: Self-pay | Admitting: Family Medicine

## 2021-07-02 ENCOUNTER — Ambulatory Visit: Payer: BC Managed Care – PPO | Admitting: Family Medicine

## 2021-07-02 ENCOUNTER — Other Ambulatory Visit: Payer: Self-pay

## 2021-07-02 VITALS — BP 124/68 | HR 90 | Temp 97.9°F | Resp 14 | Ht 64.0 in | Wt 254.0 lb

## 2021-07-02 DIAGNOSIS — G43909 Migraine, unspecified, not intractable, without status migrainosus: Secondary | ICD-10-CM | POA: Diagnosis not present

## 2021-07-02 MED ORDER — RIZATRIPTAN BENZOATE 10 MG PO TBDP: 10.0000 mg | ORAL_TABLET | ORAL | 0 refills | Status: DC | PRN

## 2021-07-02 NOTE — Progress Notes (Signed)
Subjective:    Patient ID: [name], female    DOB: Jul 26, 1986, 35 y.o.   MRN: [id number]  HPI  Patient is getting migraines now 2-3 times a month.  They are usually pulsatile although they can be pressure-like.  There is definitely photosensitivity.  She also has nausea.  If she throws up the headaches will subside.  She typically takes ibuprofen but sometimes that does not work.  She denies any phonophobia or dizziness or seizure activity or neurologic deficits or fevers or chills or neck pain.  Past Medical History:  Diagnosis Date   Ankle fracture    hx of rt   Arm fracture, left    Carpal tunnel syndrome during pregnancy    Headache    Hx of pre-eclampsia in prior pregnancy, currently pregnant    Panic attacks history of   Pregnancy induced hypertension 2014   Past Surgical History:  Procedure Laterality Date   APPENDECTOMY     CESAREAN SECTION N/A 08/28/2013   Procedure: CESAREAN SECTION;  Surgeon: Sandra Bruins, MD;  Location: [facility];  Service: Obstetrics;  Laterality: N/A;   CESAREAN SECTION N/A 02/01/2019   Procedure: CESAREAN SECTION;  Surgeon: Sandra Few, MD;  Location: [facility];  Service: Obstetrics;  Laterality: N/A;   CHOLECYSTECTOMY     tendonitis surgery  2006   TONSILLECTOMY     Current Outpatient Medications on File Prior to Visit  Medication Sig Dispense Refill   cetirizine (ZYRTEC) 10 MG tablet Take 10 mg by mouth daily.     famotidine (PEPCID) 20 MG tablet Take 20 mg by mouth See admin instructions. Take 20 mg daily, may take an additional 20 mg dose as needed for heartburn     fluticasone (FLONASE) 50 MCG/ACT nasal spray Place 1 spray into both nostrils daily as needed for allergies or rhinitis.     ibuprofen (ADVIL) 800 MG tablet Take 1 tablet (800 mg total) by mouth every 6 (six) hours. 30 tablet 0   Semaglutide, 2 MG/DOSE, (OZEMPIC, 2 MG/DOSE,) 8 MG/3ML SOPN Inject 2 mg into the skin every 7 (seven) days. 3 mL 1   venlafaxine XR  (EFFEXOR-XR) 75 MG 24 hr capsule TAKE (1) CAPSULE BY MOUTH EACH MORNING. 90 capsule 3   No current facility-administered medications on file prior to visit.   Allergies  Allergen Reactions   Adhesive [Tape] Rash    steristrips   Social History   Socioeconomic History   Marital status: Married    Spouse name: Not on file   Number of children: Not on file   Years of education: Not on file   Highest education level: Not on file  Occupational History   Not on file  Tobacco Use   Smoking status: Never   Smokeless tobacco: Never  Vaping Use   Vaping Use: Never used  Substance and Sexual Activity   Alcohol use: No    Comment: occ   Drug use: No   Sexual activity: Yes    Partners: Male    Comment: 1st intercourse- 20, partners- 1  Other Topics Concern   Not on file  Social History Narrative   Not on file   Social Determinants of Health   Financial Resource Strain: Not on file  Food Insecurity: Not on file  Transportation Needs: Not on file  Physical Activity: Not on file  Stress: Not on file  Social Connections: Not on file  Intimate Partner Violence: Not on file      Review  of Systems  All other systems reviewed and are negative.     Objective:   Physical Exam Constitutional:      Appearance: Normal appearance.  Cardiovascular:     Rate and Rhythm: Normal rate and regular rhythm.     Heart sounds: Normal heart sounds.  Pulmonary:     Effort: Pulmonary effort is normal.     Breath sounds: Normal breath sounds.  Abdominal:     Palpations: Abdomen is soft. There is no hepatomegaly, splenomegaly or mass.  Lymphadenopathy:     Cervical: No cervical adenopathy.     Lower Body: No right inguinal adenopathy. No left inguinal adenopathy.  Neurological:     Mental Status: She is alert.          Assessment & Plan:  Migraine without status migrainosus, not intractable, unspecified migraine type Try Maxalt 10 mg at the first sign of a migraine.  May repeat 1  time in 2 hours if persistent.  As long as the migraines are infrequent (less than 4 times a month) I would treat symptomatically.  If increasing in frequency, consider preventative strategies such as Topamax or Aimovig

## 2021-08-16 ENCOUNTER — Other Ambulatory Visit: Payer: Self-pay

## 2021-08-16 ENCOUNTER — Encounter: Payer: Self-pay | Admitting: Family Medicine

## 2021-08-16 ENCOUNTER — Encounter: Payer: Self-pay | Admitting: *Deleted

## 2021-08-16 ENCOUNTER — Ambulatory Visit: Payer: BC Managed Care – PPO | Admitting: Family Medicine

## 2021-08-16 DIAGNOSIS — Z6841 Body Mass Index (BMI) 40.0 and over, adult: Secondary | ICD-10-CM

## 2021-08-16 MED ORDER — OZEMPIC (2 MG/DOSE) 8 MG/3ML ~~LOC~~ SOPN: 2.0000 mg | PEN_INJECTOR | SUBCUTANEOUS | 1 refills | Status: DC

## 2021-08-16 NOTE — Progress Notes (Signed)
Subjective:    Patient ID: [name], female    DOB: [birth year], 35 y.o.   MRN: [id number]  HPI  01/10/21 Patient is a very pleasant 35 year old Caucasian female here today for a checkup.  She primarily made the appointment so that she can get a refill on the venlafaxine.  She has been on this medication for quite some time.  She feels like the medication does help as it helps to prevent anxiety and helps to calm her down and make her relax.  She does not want to wean off the medication at the present time.  However she is interested in assistance at lowering her BMI.  She has made lifestyle changes and is going to the gym frequently and working on diet however has not seen any success.  She is also complaining of joint pain and therefore her BMI is affecting her joints and likely leading to degenerative joint disease.  We discussed several options today including Adipex, Topamax, and Ozempic.  If covered by her insurance she would like to try Ozempic.  She has a history of a tubal ligation.  Therefore unintended pregnancy would not be a concern regarding the medication.  At that time, my plan was: Patient still sees benefit from venlafaxine and prefers to leave it alone and not make any adjustments to the dose.  Therefore I will be glad to give her a year supply at the current strength.  However we will try her on Ozempic 0.25 mg subcu weekly.  In 4 weeks I would increase to 0.5 mg if tolerated.  Recheck BMI in 6 weeks for insurance purposes and then every 6 months thereafter.  If insurance will not cover Ozempic, we could try Adipex instead.  Patient is encouraged to continue lifestyle changes.  Meanwhile check CBC and CMP and I would like the patient to return fasting for lipid panel at her convenience. I suspect that this is a reactive lymph node either Covid vaccination she recently received deltoid or perhaps some irritation from shaving.  I do not appreciate any discernible mass other than what  appears to be a normal size lymph node in the left axilla.  There is no evidence of any fluctuance or abscess or cellulitis.  Therefore I recommended that we monitor this area.  If enlarging, we could obtain a ultrasound of the left axilla to evaluate further however I suspect that the lymphadenopathy will pass over the next 2 to 3 weeks with no further complications. Wt Readings from Last 3 Encounters:  07/02/21 254 lb (115.2 kg)  02/21/21 267 lb (121.1 kg)  01/10/21 276 lb (125.2 kg)  Patient has lost 9 pounds since her last visit!  I congratulated her on this.  She is on 0.5 mg of Ozempic.  She states that this will be her third dose today.  She has tolerated it thus far.  She states she occasionally has nausea if she overeats.  However if she limits her intake, the medicine is not causing her any side effects.  She would like to increase the dose.  At that time, my plan was:  Increase Ozempic to 1 mg a week.  Reassess in 1 month.  Increase dose as tolerated to maximum on a monthly basis.  Recheck in 6 months to recheck BMI.  08/16/21 Patient's weight has dropped further from 254 down to 250.  She continues to have migraines.  We discussed switching to Topamax to help with migraine prevention and also facilitate additional  weight loss.  At the present time she is comfortable with Ozempic 2 mg subcu weekly however she is having a difficult time finding it at her local pharmacy due to a backorder.  She would like to try sending it to a different pharmacy.  She denies any nausea or vomiting.  She denies any abdominal pain.  Past Medical History:  Diagnosis Date   Ankle fracture    hx of rt   Arm fracture, left    Carpal tunnel syndrome during pregnancy    Headache    Hx of pre-eclampsia in prior pregnancy, currently pregnant    Panic attacks history of   Pregnancy induced hypertension 2014   Past Surgical History:  Procedure Laterality Date   APPENDECTOMY     CESAREAN SECTION N/A 08/28/2013    Procedure: CESAREAN SECTION;  Surgeon: Princess Bruins, MD;  Location: [facility];  Service: Obstetrics;  Laterality: N/A;   CESAREAN SECTION N/A 02/01/2019   Procedure: CESAREAN SECTION;  Surgeon: Brien Few, MD;  Location: [facility];  Service: Obstetrics;  Laterality: N/A;   CHOLECYSTECTOMY     tendonitis surgery  2006   TONSILLECTOMY     Current Outpatient Medications on File Prior to Visit  Medication Sig Dispense Refill   cetirizine (ZYRTEC) 10 MG tablet Take 10 mg by mouth daily.     famotidine (PEPCID) 20 MG tablet Take 20 mg by mouth See admin instructions. Take 20 mg daily, may take an additional 20 mg dose as needed for heartburn     fluticasone (FLONASE) 50 MCG/ACT nasal spray Place 1 spray into both nostrils daily as needed for allergies or rhinitis.     ibuprofen (ADVIL) 800 MG tablet Take 1 tablet (800 mg total) by mouth every 6 (six) hours. 30 tablet 0   rizatriptan (MAXALT-MLT) 10 MG disintegrating tablet Take 1 tablet (10 mg total) by mouth as needed for migraine. May repeat in 2 hours if needed 10 tablet 0   Semaglutide, 2 MG/DOSE, (OZEMPIC, 2 MG/DOSE,) 8 MG/3ML SOPN Inject 2 mg into the skin every 7 (seven) days. 3 mL 1   venlafaxine XR (EFFEXOR-XR) 75 MG 24 hr capsule TAKE (1) CAPSULE BY MOUTH EACH MORNING. 90 capsule 3   No current facility-administered medications on file prior to visit.   Allergies  Allergen Reactions   Adhesive [Tape] Rash    steristrips   Social History   Socioeconomic History   Marital status: Married    Spouse name: Not on file   Number of children: Not on file   Years of education: Not on file   Highest education level: Not on file  Occupational History   Not on file  Tobacco Use   Smoking status: Never   Smokeless tobacco: Never  Vaping Use   Vaping Use: Never used  Substance and Sexual Activity   Alcohol use: No    Comment: occ   Drug use: No   Sexual activity: Yes    Partners: Male    Comment: 1st intercourse- 89,  partners- 1  Other Topics Concern   Not on file  Social History Narrative   Not on file   Social Determinants of Health   Financial Resource Strain: Not on file  Food Insecurity: Not on file  Transportation Needs: Not on file  Physical Activity: Not on file  Stress: Not on file  Social Connections: Not on file  Intimate Partner Violence: Not on file      Review of Systems  All  other systems reviewed and are negative.     Objective:   Physical Exam Constitutional:      Appearance: Normal appearance.  Cardiovascular:     Rate and Rhythm: Normal rate and regular rhythm.     Heart sounds: Normal heart sounds.  Pulmonary:     Effort: Pulmonary effort is normal.     Breath sounds: Normal breath sounds.  Abdominal:     Palpations: Abdomen is soft. There is no hepatomegaly, splenomegaly or mass.  Lymphadenopathy:     Cervical: No cervical adenopathy.     Lower Body: No right inguinal adenopathy. No left inguinal adenopathy.  Neurological:     Mental Status: She is alert.          Assessment & Plan:  Class 3 severe obesity with serious comorbidity and body mass index (BMI) of 40.0 to 44.9 in adult, unspecified obesity type ([place]) Patient continues to lose weight and has dropped from 2 76-2 50 which is a total of 26 pounds.  Therefore she has lost approximately 10% of her body weight.  Her BMI is still elevated at 42.9.  We will continue Ozempic 2 mg subcu weekly.  I did suggest trying to add Topamax as an appetite suppressant and for migraine prevention but the patient declines at the present time

## 2021-08-27 ENCOUNTER — Ambulatory Visit: Payer: BC Managed Care – PPO | Admitting: Family Medicine

## 2021-09-13 ENCOUNTER — Ambulatory Visit: Payer: BC Managed Care – PPO | Admitting: Family Medicine

## 2021-09-25 ENCOUNTER — Other Ambulatory Visit: Payer: Self-pay | Admitting: Family Medicine

## 2021-09-25 DIAGNOSIS — D485 Neoplasm of uncertain behavior of skin: Secondary | ICD-10-CM | POA: Insufficient documentation

## 2021-09-25 MED ORDER — RIZATRIPTAN BENZOATE 10 MG PO TBDP: 10.0000 mg | ORAL_TABLET | ORAL | 0 refills | Status: DC | PRN

## 2021-12-23 ENCOUNTER — Other Ambulatory Visit: Payer: Self-pay | Admitting: Family Medicine

## 2021-12-24 ENCOUNTER — Other Ambulatory Visit: Payer: Self-pay | Admitting: Family Medicine

## 2021-12-24 ENCOUNTER — Other Ambulatory Visit: Payer: Self-pay

## 2021-12-24 NOTE — Telephone Encounter (Signed)
Last refill 08/16/21, 51m(1 pen) with 1 refill. ?pt is not taking med as directed. ? ?Please advice  ?

## 2021-12-25 NOTE — Telephone Encounter (Signed)
Duplicate request

## 2022-02-17 ENCOUNTER — Other Ambulatory Visit: Payer: Self-pay | Admitting: Family Medicine

## 2022-02-17 MED ORDER — OZEMPIC (2 MG/DOSE) 8 MG/3ML ~~LOC~~ SOPN: 2.0000 mg | PEN_INJECTOR | SUBCUTANEOUS | 0 refills | Status: DC

## 2022-02-19 ENCOUNTER — Ambulatory Visit
Admission: EM | Admit: 2022-02-19 | Discharge: 2022-02-19 | Disposition: A | Discharge status: Home / Self Care | Payer: BC Managed Care – PPO

## 2022-02-19 ENCOUNTER — Encounter: Payer: Self-pay | Admitting: Emergency Medicine

## 2022-02-19 DIAGNOSIS — R112 Nausea with vomiting, unspecified: Secondary | ICD-10-CM | POA: Diagnosis not present

## 2022-02-19 DIAGNOSIS — T887XXA Unspecified adverse effect of drug or medicament, initial encounter: Secondary | ICD-10-CM

## 2022-02-19 LAB — POCT URINALYSIS DIP (MANUAL ENTRY)
Glucose, UA: NEGATIVE mg/dL
Leukocytes, UA: NEGATIVE
Nitrite, UA: NEGATIVE
Protein Ur, POC: 100 mg/dL — AB
Spec Grav, UA: >=1.03 — AB (ref 1.010–1.025)
Urobilinogen, UA: 0.2 E.U./dL
pH, UA: 6 (ref 5.0–8.0)

## 2022-02-19 MED ORDER — ONDANSETRON HCL 4 MG/2ML IJ SOLN
4.0000 mg | Freq: Once | INTRAMUSCULAR | Status: AC
  Administered 2022-02-19: 4 mg via INTRAMUSCULAR

## 2022-02-19 MED ORDER — ONDANSETRON 4 MG PO TBDP: 4.0000 mg | ORAL_TABLET | Freq: Three times a day (TID) | ORAL | 0 refills | Status: DC | PRN

## 2022-02-19 NOTE — Discharge Instructions (Addendum)
Your urinalysis shows you have not been drinking. Increase your fluids at this time. Try to drink at least 6 to 8 8 oz glasses of water. If you are unable to keep liquids down, eat ice chips. ?Take medication as prescribed. ?As discussed, you should follow-up with your physician who prescribes the Ozempic and determine a dose that you can start in order to taper you back up to your regular dose to prevent further side effects.  Do not take the medication until you have spoken with this provider. ?Recommend ice chips until you can start keeping liquids down.  Once you are able to tolerate ice chips, recommend a brat diet, bananas, rice, applesauce, and toast.  You can then taper up to a soft diet then back to a regular diet as tolerated. ?If your nausea and vomiting do not improve within the next 24 hours, recommend you follow-up in the ER for further evaluation. ?

## 2022-02-19 NOTE — ED Provider Notes (Signed)
[facility]    CSN: [id number] Arrival date & time: 02/19/22  1101      History   Chief Complaint No chief complaint on file.   HPI [name] is a 36 y.o. female.   The patient is a 36 year old female who presents with her spouse for nausea and vomiting.  Symptoms have been present for the past 24 hours.  She states since that time she has not been able to keep down "anything".  Her husband states that her emesis looks like it is "bright green" like she has been eating spinach or grass.  Patient states the night prior she had [employer], of which her husband had the same type of sub/meat.  He is asymptomatic.  The patient also takes Ozempic for weight loss.  She states she has not been on the medication for approximately 1 month and then restarted the injection at the previous dose on Monday evening before her symptoms started.  The patient denies fever, chills, abdominal pain, diarrhea.  She also complains of headache and neck pain.  Her headache and neck pain also started when her nausea and vomiting started.  She denies any sick contacts.  States that she did try Zofran tablets, but she was also unable to keep those down. She denies any chance of pregnancy, appendicitis or cholecystitis.   The history is provided by the patient.   Past Medical History:  Diagnosis Date   Ankle fracture    hx of rt   Arm fracture, left    Carpal tunnel syndrome during pregnancy    Headache    Hx of pre-eclampsia in prior pregnancy, currently pregnant    Panic attacks history of   Pregnancy induced hypertension 2014    Patient Active Problem List   Diagnosis Date Noted   Neoplasm of uncertain behavior of skin 09/25/2021   Preeclampsia, third trimester 02/01/2019   Previous cesarean section 02/01/2019   R C/S w BTL 02/01/2019   GERD (gastroesophageal reflux disease) 03/30/2017   Generalized anxiety disorder 03/30/2017   Panic disorder 03/30/2017   Postpartum care  following cesarean delivery (4/28) 08/29/2013    Past Surgical History:  Procedure Laterality Date   APPENDECTOMY     CESAREAN SECTION N/A 08/28/2013   Procedure: CESAREAN SECTION;  Surgeon: Genia Del, MD;  Location: [facility];  Service: Obstetrics;  Laterality: N/A;   CESAREAN SECTION N/A 02/01/2019   Procedure: CESAREAN SECTION;  Surgeon: Olivia Mackie, MD;  Location: [facility];  Service: Obstetrics;  Laterality: N/A;   CHOLECYSTECTOMY     tendonitis surgery  2006   TONSILLECTOMY      OB History     Gravida  3   Para  1   Term  1   Preterm      AB  1   Living  1      SAB  1   IAB      Ectopic      Multiple      Live Births  1            Home Medications    Prior to Admission medications   Medication Sig Start Date End Date Taking? Authorizing Provider  ondansetron (ZOFRAN-ODT) 4 MG disintegrating tablet Take 1 tablet (4 mg total) by mouth every 8 (eight) hours as needed for nausea or vomiting. 02/19/22  Yes Leath-Warren, Sadie Haber, NP  predniSONE (DELTASONE) 1 MG tablet Take 1 mg by mouth daily with breakfast.   Yes  [provider]  cetirizine (ZYRTEC) 10 MG tablet Take 10 mg by mouth daily.    [provider]  famotidine (PEPCID) 20 MG tablet Take 20 mg by mouth See admin instructions. Take 20 mg daily, may take an additional 20 mg dose as needed for heartburn    [provider]  fluticasone (FLONASE) 50 MCG/ACT nasal spray Place 1 spray into both nostrils daily as needed for allergies or rhinitis.    [provider]  ibuprofen (ADVIL) 800 MG tablet Take 1 tablet (800 mg total) by mouth every 6 (six) hours. 02/03/19   Neta Mends, CNM  Loteprednol Etabonate 0.5 % GEL Apply 1 drop to eye 4 (four) times daily. 11/27/21   [provider]  rizatriptan (MAXALT-MLT) 10 MG disintegrating tablet Dissolve 1 tablet (10 mg total) by mouth asneeded for migraine. May repeat in 2 hours if needed. Max 2 doses in 24  hours, max 3 days per week. 12/24/21   Donita Brooks, MD  Semaglutide, 2 MG/DOSE, (OZEMPIC, 2 MG/DOSE,) 8 MG/3ML SOPN Inject 2 mg into the skin every 7 (seven) days. 02/17/22   Donita Brooks, MD  triamcinolone ointment (KENALOG) 0.5 % Apply topically 2 (two) times daily. 12/09/21   [provider]  venlafaxine XR (EFFEXOR-XR) 75 MG 24 hr capsule TAKE (1) CAPSULE BY MOUTH EACH MORNING. 01/10/21   Donita Brooks, MD    Family History Family History  Problem Relation Age of Onset   Stroke Paternal Grandmother    Cancer Maternal Uncle        leukemia   Cancer Maternal Grandfather        leukemia   Heart attack Maternal Grandfather     Social History Social History   Tobacco Use   Smoking status: Never   Smokeless tobacco: Never  Vaping Use   Vaping Use: Never used  Substance Use Topics   Alcohol use: No    Comment: occ   Drug use: No     Allergies   Adhesive [tape]   Review of Systems Review of Systems  Constitutional:  Positive for activity change, appetite change, chills and fatigue. Negative for fever.  Respiratory: Negative.    Cardiovascular: Negative.   Gastrointestinal:  Positive for nausea and vomiting. Negative for abdominal pain and diarrhea.  Skin: Negative.   Psychiatric/Behavioral: Negative.      Physical Exam Triage Vital Signs ED Triage Vitals  Enc Vitals Group     BP 02/19/22 1108 120/85     Pulse Rate 02/19/22 1108 (!) 105     Resp 02/19/22 1108 18     Temp 02/19/22 1108 99 F (37.2 C)     Temp Source 02/19/22 1108 Oral     SpO2 02/19/22 1108 95 %     Weight --      Height --      Head Circumference --      Peak Flow --      Pain Score 02/19/22 1109 5     Pain Loc --      Pain Edu? --      Excl. in GC? --    No data found.  Updated Vital Signs BP 120/85 (BP Location: Right Arm)   Pulse (!) 105   Temp 99 F (37.2 C) (Oral)   Resp 18   LMP 02/07/2022 (Approximate)   SpO2 95%   Visual Acuity Right Eye Distance:    Left Eye Distance:   Bilateral Distance:  Right Eye Near:   Left Eye Near:    Bilateral Near:     Physical Exam Vitals and nursing note reviewed.  Constitutional:      Appearance: Normal appearance.  HENT:     Head: Normocephalic.     Nose: Nose normal.  Eyes:     Extraocular Movements: Extraocular movements intact.     Pupils: Pupils are equal, round, and reactive to light.  Cardiovascular:     Rate and Rhythm: Regular rhythm. Tachycardia present.  Pulmonary:     Effort: Pulmonary effort is normal.     Breath sounds: Normal breath sounds.  Abdominal:     General: Bowel sounds are normal. There is no distension.     Palpations: Abdomen is soft. There is no mass.     Tenderness: There is no abdominal tenderness. There is no right CVA tenderness, left CVA tenderness, guarding or rebound.     Hernia: No hernia is present.  Musculoskeletal:     Cervical back: Normal range of motion.  Skin:    General: Skin is warm and dry.     Capillary Refill: Capillary refill takes less than 2 seconds.  Neurological:     General: No focal deficit present.     Mental Status: She is alert and oriented to person, place, and time.  Psychiatric:        Mood and Affect: Mood normal.        Behavior: Behavior normal.     UC Treatments / Results  Labs (all labs ordered are listed, but only abnormal results are displayed) Labs Reviewed  POCT URINALYSIS DIP (MANUAL ENTRY)     EKG   Radiology No results found.  Procedures Procedures (including critical care time)  Medications Ordered in UC Medications  ondansetron (ZOFRAN) injection 4 mg (4 mg Intramuscular Given 02/19/22 1130)    Initial Impression / Assessment and Plan / UC Course  I have reviewed the triage vital signs and the nursing notes.  Pertinent labs & imaging results that were available during my care of the patient were reviewed by me and considered in my medical decision making (see chart for details).  The  patient is a 36 year old female who presents with nausea and vomiting.  Symptoms have been present for the past 24 hours.  Her exam does not reveal any symptoms of acute abdomen as she has no abdominal tenderness, fever, chills, or intractable nausea and vomiting.  She also has had an appendectomy and cholecystectomy.  Based on the patient's symptoms and her history, symptoms are consistent with side effects of the Ozempic she is currently taking for weight loss.  Patient states that she was off the medication for 1 month and then restarted the medication the evening prior to her current symptoms.  Explained to the patient that usually with these type medications you have to taper back up to your regular dose to avoid side effects.  Patient was given ondansetron in the office today.  We will also prescribe ondansetron disintegrating tablets for her symptoms.  Recommended ice chips until her nausea improves, then taper up to a brat diet, a soft diet, then return to her regular diet.  Patient was advised that if her nausea and vomiting do not improve, she will need to go to the ER for further evaluation. Final Clinical Impressions(s) / UC Diagnoses   Final diagnoses:  Nausea and vomiting, unspecified vomiting type  Medication side effects     Discharge Instructions  Take medication as prescribed. As discussed, you should follow-up with your physician who prescribes the Ozempic and determine a dose that you can start in order to taper you back up to your regular dose to prevent further side effects.  Do not take the medication until you have spoken with this provider. Recommend ice chips until you can start keeping liquids down.  Once you are able to tolerate ice chips, recommend a brat diet, bananas, rice, applesauce, and toast.  You can then taper up to a soft diet then back to a regular diet as tolerated. If your nausea and vomiting do not improve within the next 24 hours, recommend you follow-up  in the ER for further evaluation.     ED Prescriptions     Medication Sig Dispense Auth. Provider   ondansetron (ZOFRAN-ODT) 4 MG disintegrating tablet Take 1 tablet (4 mg total) by mouth every 8 (eight) hours as needed for nausea or vomiting. 20 tablet Leath-Warren, Sadie Haber, NP      PDMP not reviewed this encounter.   Abran Cantor, NP 02/19/22 1210

## 2022-02-19 NOTE — ED Triage Notes (Signed)
Nausea and vomiting since yesterday.  Head pain and neck pain.   ?

## 2022-04-04 ENCOUNTER — Other Ambulatory Visit: Payer: Self-pay | Admitting: Family Medicine

## 2022-04-04 ENCOUNTER — Encounter: Payer: Self-pay | Admitting: Family Medicine

## 2022-04-04 DIAGNOSIS — F410 Panic disorder [episodic paroxysmal anxiety] without agoraphobia: Secondary | ICD-10-CM

## 2022-04-04 MED ORDER — VENLAFAXINE HCL ER 75 MG PO CP24: ORAL_CAPSULE | ORAL | 3 refills | Status: DC

## 2022-04-04 NOTE — Telephone Encounter (Signed)
Refilled 04/04/22.

## 2022-04-04 NOTE — Telephone Encounter (Signed)
Pt called in to request a courtesy refill of venlafaxine XR (EFFEXOR-XR) 75 MG 24 hr capsule [[id number]] to last until her scheduled ov with pcp on 7/28. Please advise.  Cb#: [phone]

## 2022-04-04 NOTE — Telephone Encounter (Signed)
Requested medication (s) are due for refill today: yes  Requested medication (s) are on the active medication list: yes  Last refill:  01/10/21 #90/3  Future visit scheduled: no  Notes to clinic:  Unable to refill per protocol due to failed labs, no updated results.      Requested Prescriptions  Pending Prescriptions Disp Refills   venlafaxine XR (EFFEXOR-XR) 75 MG 24 hr capsule [Pharmacy Med Name: VENLAFAXINE HCL ER 75 MG CAP] 90 capsule 0    Sig: TAKE (1) CAPSULE BY MOUTH EACH MORNING.     Psychiatry: Antidepressants - SNRI - desvenlafaxine & venlafaxine Failed - 04/04/2022 12:25 PM      Failed - Cr in normal range and within 360 days    Creat  Date Value Ref Range Status  01/10/2021 0.77 0.50 - 1.10 mg/dL Final   Creatinine, Urine  Date Value Ref Range Status  02/01/2019 66.84 mg/dL Final         Failed - Valid encounter within last 6 months    Recent Outpatient Visits           7 months ago Class 3 severe obesity with serious comorbidity and body mass index (BMI) of 40.0 to 44.9 in adult, unspecified obesity type ([place])   [place] Susy Frizzle, MD   9 months ago Migraine without status migrainosus, not intractable, unspecified migraine type   [place] Pickard, Cammie Mcgee, MD   1 year ago Class 3 severe obesity with serious comorbidity and body mass index (BMI) of 40.0 to 44.9 in adult, unspecified obesity type [facility])   [place] Susy Frizzle, MD   1 year ago Class 3 severe obesity with serious comorbidity and body mass index (BMI) of 40.0 to 44.9 in adult, unspecified obesity type [facility])   [place] Pickard, Cammie Mcgee, MD   2 years ago Reactive lymphadenopathy   [place], Warren T, MD              Failed - Lipid Panel in normal range within the last 12 months    Cholesterol  Date Value Ref Range Status  09/05/2016 180 <200 mg/dL Final    Comment:    **  Please note change in reference range(s). **      LDL Cholesterol  Date Value Ref Range Status  09/05/2016 112 (H) <100 mg/dL Final    Comment:    ** Please note change in reference range(s). **      HDL  Date Value Ref Range Status  09/05/2016 47 (L) >50 mg/dL Final    Comment:    ** Please note change in reference range(s). **      Triglycerides  Date Value Ref Range Status  09/05/2016 106 <150 mg/dL Final    Comment:    ** Please note change in reference range(s). **            Passed - Last BP in normal range    BP Readings from Last 1 Encounters:  02/19/22 120/85

## 2022-05-02 ENCOUNTER — Ambulatory Visit: Payer: BC Managed Care – PPO | Admitting: Family Medicine

## 2022-05-02 VITALS — BP 130/90 | HR 78 | Temp 98.6°F | Ht 64.0 in | Wt 275.0 lb

## 2022-05-02 DIAGNOSIS — F410 Panic disorder [episodic paroxysmal anxiety] without agoraphobia: Secondary | ICD-10-CM | POA: Diagnosis not present

## 2022-05-02 DIAGNOSIS — R030 Elevated blood-pressure reading, without diagnosis of hypertension: Secondary | ICD-10-CM

## 2022-05-02 DIAGNOSIS — M5412 Radiculopathy, cervical region: Secondary | ICD-10-CM

## 2022-05-02 DIAGNOSIS — F411 Generalized anxiety disorder: Secondary | ICD-10-CM | POA: Diagnosis not present

## 2022-05-02 LAB — CBC WITH DIFFERENTIAL/PLATELET
Eosinophils Absolute: 98 cells/uL (ref 15–500)
Eosinophils Relative: 1.5 %
Neutrophils Relative %: 46.4 %

## 2022-05-02 MED ORDER — VENLAFAXINE HCL ER 75 MG PO CP24: ORAL_CAPSULE | ORAL | 3 refills | Status: DC

## 2022-05-02 NOTE — Progress Notes (Signed)
Subjective:    Patient ID: [name], female    DOB: [birth year], 36 y.o.   MRN: [id number]  HPI  Patient has an elevated blood pressure today but she attributes this to having some stress in the family prior to showing up for the appointment.  She states that her blood pressure is usually well controlled.  The most recent time was 117/80.  She denies any chest pain, shortness of breath, or dyspnea on exertion.  She is due for fasting lab work.  She discontinued Ozempic.  She had only lost 10 pounds on the medication and due to the cost, she did not feel that it was beneficial.  She is trying a low carbohydrate diet now.  Otherwise she is doing well.  She would like to refill her Effexor if she sees significant benefit from that medication.  Of note, she has recently been having pain on the right side of her neck every morning when she wakes up.  The pain will radiate into the clavicle and she will have numbness and tingling radiating down her right arm into all 5 of her fingers.  She denies any neck injury. Past Medical History:  Diagnosis Date   Ankle fracture    hx of rt   Arm fracture, left    Carpal tunnel syndrome during pregnancy    Headache    Hx of pre-eclampsia in prior pregnancy, currently pregnant    Panic attacks history of   Pregnancy induced hypertension 2014   Past Surgical History:  Procedure Laterality Date   APPENDECTOMY     CESAREAN SECTION N/A 08/28/2013   Procedure: CESAREAN SECTION;  Surgeon: Princess Bruins, MD;  Location: [facility];  Service: Obstetrics;  Laterality: N/A;   CESAREAN SECTION N/A 02/01/2019   Procedure: CESAREAN SECTION;  Surgeon: Brien Few, MD;  Location: [place] [facility];  Service: Obstetrics;  Laterality: N/A;   CHOLECYSTECTOMY     tendonitis surgery  2006   TONSILLECTOMY     Current Outpatient Medications on File Prior to Visit  Medication Sig Dispense Refill   cetirizine (ZYRTEC) 10 MG tablet Take 10 mg by mouth daily.     famotidine  (PEPCID) 20 MG tablet Take 20 mg by mouth See admin instructions. Take 20 mg daily, may take an additional 20 mg dose as needed for heartburn     fluticasone (FLONASE) 50 MCG/ACT nasal spray Place 1 spray into both nostrils daily as needed for allergies or rhinitis.     ibuprofen (ADVIL) 800 MG tablet Take 1 tablet (800 mg total) by mouth every 6 (six) hours. 30 tablet 0   Loteprednol Etabonate 0.5 % GEL Apply 1 drop to eye 4 (four) times daily.     rizatriptan (MAXALT-MLT) 10 MG disintegrating tablet Dissolve 1 tablet (10 mg total) by mouth asneeded for migraine. May repeat in 2 hours if needed. Max 2 doses in 24 hours, max 3 days per week. 10 tablet 0   triamcinolone ointment (KENALOG) 0.5 % Apply topically 2 (two) times daily.     ondansetron (ZOFRAN-ODT) 4 MG disintegrating tablet Take 1 tablet (4 mg total) by mouth every 8 (eight) hours as needed for nausea or vomiting. (Patient not taking: Reported on 05/02/2022) 20 tablet 0   predniSONE (DELTASONE) 1 MG tablet Take 1 mg by mouth daily with breakfast. (Patient not taking: Reported on 05/02/2022)     Semaglutide, 2 MG/DOSE, (OZEMPIC, 2 MG/DOSE,) 8 MG/3ML SOPN Inject 2 mg into the skin every 7 (seven) days. (  Patient not taking: Reported on 05/02/2022) 3 mL 0   No current facility-administered medications on file prior to visit.   Allergies  Allergen Reactions   Adhesive [Tape] Rash    steristrips   Social History   Socioeconomic History   Marital status: Married    Spouse name: Not on file   Number of children: Not on file   Years of education: Not on file   Highest education level: Not on file  Occupational History   Not on file  Tobacco Use   Smoking status: Never   Smokeless tobacco: Never  Vaping Use   Vaping Use: Never used  Substance and Sexual Activity   Alcohol use: No    Comment: occ   Drug use: No   Sexual activity: Yes    Partners: Male    Comment: 1st intercourse- 12, partners- 1  Other Topics Concern   Not on  file  Social History Narrative   Not on file   Social Determinants of Health   Financial Resource Strain: Low Risk  (01/26/2019)   Overall Financial Resource Strain (CARDIA)    Difficulty of Paying Living Expenses: Not hard at all  Food Insecurity: No Food Insecurity (01/26/2019)   Hunger Vital Sign    Worried About Running Out of Food in the Last Year: Never true    [place] in the Last Year: Never true  Transportation Needs: Unknown (01/26/2019)   PRAPARE - Hydrologist (Medical): No    Lack of Transportation (Non-Medical): Not on file  Physical Activity: Not on file  Stress: No Stress Concern Present (01/26/2019)   [place]    Feeling of Stress : Only a little  Social Connections: Not on file  Intimate Partner Violence: Not At Risk (01/26/2019)   Humiliation, Afraid, Rape, and Kick questionnaire    Fear of Current or Ex-Partner: No    Emotionally Abused: No    Physically Abused: No    Sexually Abused: No      Review of Systems  All other systems reviewed and are negative.      Objective:   Physical Exam Constitutional:      Appearance: Normal appearance.  Cardiovascular:     Rate and Rhythm: Normal rate and regular rhythm.     Heart sounds: Normal heart sounds.  Pulmonary:     Effort: Pulmonary effort is normal.     Breath sounds: Normal breath sounds.  Abdominal:     Palpations: Abdomen is soft. There is no hepatomegaly, splenomegaly or mass.  Lymphadenopathy:     Cervical: No cervical adenopathy.     Lower Body: No right inguinal adenopathy. No left inguinal adenopathy.  Neurological:     Mental Status: She is alert.           Assessment & Plan:  Elevated blood pressure reading - Plan: CBC with Differential/Platelet, Lipid panel, COMPLETE METABOLIC PANEL WITH GFR  Generalized anxiety disorder with panic attacks - Plan: venlafaxine XR (EFFEXOR-XR) 75 MG  24 hr capsule, DISCONTINUED: venlafaxine XR (EFFEXOR-XR) 75 MG 24 hr capsule  Cervical radiculopathy - Plan: DG Cervical Spine Complete I have asked the patient to monitor her blood pressure and let me know if it is greater than 140/90.  I refilled her Effexor.  Check CBC CMP and a lipid panel.  Goal LDL cholesterol is less than 100.  I have also suggested getting an x-ray of her  cervical spine given the cervical radiculopathy she has been experiencing over the last month.  She states that the symptoms began in early July

## 2022-05-03 LAB — CBC WITH DIFFERENTIAL/PLATELET
Absolute Monocytes: 572 cells/uL (ref 200–950)
Basophils Absolute: 39 cells/uL (ref 0–200)
Basophils Relative: 0.6 %
HCT: 40.3 % (ref 35.0–45.0)
Hemoglobin: 13.6 g/dL (ref 11.7–15.5)
Lymphs Abs: 2776 cells/uL (ref 850–3900)
MCH: 29.6 pg (ref 27.0–33.0)
MCHC: 33.7 g/dL (ref 32.0–36.0)
MCV: 87.6 fL (ref 80.0–100.0)
MPV: 10.1 fL (ref 7.5–12.5)
Monocytes Relative: 8.8 %
Neutro Abs: 3016 cells/uL (ref 1500–7800)
Platelets: 305 10*3/uL (ref 140–400)
RBC: 4.6 10*6/uL (ref 3.80–5.10)
RDW: 13.1 % (ref 11.0–15.0)
Total Lymphocyte: 42.7 %
WBC: 6.5 10*3/uL (ref 3.8–10.8)

## 2022-05-03 LAB — COMPLETE METABOLIC PANEL WITH GFR
AG Ratio: 1.5 (calc) (ref 1.0–2.5)
ALT: 25 U/L (ref 6–29)
AST: 22 U/L (ref 10–30)
Albumin: 4.5 g/dL (ref 3.6–5.1)
Alkaline phosphatase (APISO): 58 U/L (ref 31–125)
BUN: 9 mg/dL (ref 7–25)
CO2: 25 mmol/L (ref 20–32)
Calcium: 9.4 mg/dL (ref 8.6–10.2)
Chloride: 103 mmol/L (ref 98–110)
Creat: 0.74 mg/dL (ref 0.50–0.97)
Globulin: 3 g/dL (calc) (ref 1.9–3.7)
Glucose, Bld: 91 mg/dL (ref 65–99)
Potassium: 4.4 mmol/L (ref 3.5–5.3)
Sodium: 138 mmol/L (ref 135–146)
Total Bilirubin: 0.4 mg/dL (ref 0.2–1.2)
Total Protein: 7.5 g/dL (ref 6.1–8.1)
eGFR: 107 mL/min/{1.73_m2} (ref >=60)

## 2022-05-03 LAB — LIPID PANEL
Cholesterol: 152 mg/dL (ref <200)
HDL: 49 mg/dL — ABNORMAL LOW (ref >=50)
LDL Cholesterol (Calc): 85 mg/dL (calc)
Non-HDL Cholesterol (Calc): 103 mg/dL (calc) (ref <130)
Total CHOL/HDL Ratio: 3.1 (calc) (ref <5.0)
Triglycerides: 88 mg/dL (ref <150)

## 2022-05-06 ENCOUNTER — Ambulatory Visit
Admission: RE | Admit: 2022-05-06 | Discharge: 2022-05-06 | Disposition: A | Discharge status: Home / Self Care | Payer: BC Managed Care – PPO | Source: Ambulatory Visit | Attending: Family Medicine | Admitting: Family Medicine

## 2022-05-06 DIAGNOSIS — M5412 Radiculopathy, cervical region: Secondary | ICD-10-CM

## 2022-05-08 ENCOUNTER — Other Ambulatory Visit: Payer: Self-pay

## 2022-05-08 MED ORDER — CYCLOBENZAPRINE HCL 5 MG PO TABS: ORAL_TABLET | ORAL | 1 refills | Status: DC

## 2022-05-22 ENCOUNTER — Ambulatory Visit: Payer: BC Managed Care – PPO | Admitting: Family Medicine

## 2022-05-22 VITALS — BP 122/82 | HR 79 | Temp 98.6°F | Ht 64.0 in | Wt 277.0 lb

## 2022-05-22 DIAGNOSIS — J40 Bronchitis, not specified as acute or chronic: Secondary | ICD-10-CM | POA: Diagnosis not present

## 2022-05-22 MED ORDER — AZITHROMYCIN 250 MG PO TABS: ORAL_TABLET | ORAL | 0 refills | Status: DC

## 2022-05-22 MED ORDER — HYDROCODONE BIT-HOMATROP MBR 5-1.5 MG/5ML PO SOLN: 5.0000 mL | Freq: Three times a day (TID) | ORAL | 0 refills | Status: DC | PRN

## 2022-05-22 NOTE — Progress Notes (Signed)
Subjective:    Patient ID: [name], female    DOB: 07-01-1986, 36 y.o.   MRN: [id number]  HPI  Was seen at Urgent care 8/7 for 1 week history of cough and wheezing and was given medrol dose pack and tessalon for bronchitis.  Here for follow up.  Patient has not had a cough.  She denies any fevers or chills.  She denies any chest pain.  She denies any pleurisy.  She denies any hemoptysis.  She denies any purulent sputum.  She denies any rhinorrhea or postnasal drip or sinus pain however the cough will not go away.  She denies any heartburn Past Medical History:  Diagnosis Date   Ankle fracture    hx of rt   Arm fracture, left    Carpal tunnel syndrome during pregnancy    Headache    Hx of pre-eclampsia in prior pregnancy, currently pregnant    Panic attacks history of   Pregnancy induced hypertension 2014   Past Surgical History:  Procedure Laterality Date   APPENDECTOMY     CESAREAN SECTION N/A 08/28/2013   Procedure: CESAREAN SECTION;  Surgeon: Princess Bruins, MD;  Location: [facility];  Service: Obstetrics;  Laterality: N/A;   CESAREAN SECTION N/A 02/01/2019   Procedure: CESAREAN SECTION;  Surgeon: Brien Few, MD;  Location: [facility] [facility];  Service: Obstetrics;  Laterality: N/A;   CHOLECYSTECTOMY     tendonitis surgery  2006   TONSILLECTOMY     Current Outpatient Medications on File Prior to Visit  Medication Sig Dispense Refill   cetirizine (ZYRTEC) 10 MG tablet Take 10 mg by mouth daily.     cyclobenzaprine (FLEXERIL) 5 MG tablet Take 1 tab ('5mg'$ ) poq 8 hrs prn pain. 30 tablet 1   famotidine (PEPCID) 20 MG tablet Take 20 mg by mouth See admin instructions. Take 20 mg daily, may take an additional 20 mg dose as needed for heartburn     fluticasone (FLONASE) 50 MCG/ACT nasal spray Place 1 spray into both nostrils daily as needed for allergies or rhinitis.     ibuprofen (ADVIL) 800 MG tablet Take 1 tablet (800 mg total) by mouth every 6 (six) hours. 30 tablet 0    Loteprednol Etabonate 0.5 % GEL Apply 1 drop to eye 4 (four) times daily.     ondansetron (ZOFRAN-ODT) 4 MG disintegrating tablet Take 1 tablet (4 mg total) by mouth every 8 (eight) hours as needed for nausea or vomiting. (Patient not taking: Reported on 05/02/2022) 20 tablet 0   predniSONE (DELTASONE) 1 MG tablet Take 1 mg by mouth daily with breakfast. (Patient not taking: Reported on 05/02/2022)     rizatriptan (MAXALT-MLT) 10 MG disintegrating tablet Dissolve 1 tablet (10 mg total) by mouth asneeded for migraine. May repeat in 2 hours if needed. Max 2 doses in 24 hours, max 3 days per week. 10 tablet 0   Semaglutide, 2 MG/DOSE, (OZEMPIC, 2 MG/DOSE,) 8 MG/3ML SOPN Inject 2 mg into the skin every 7 (seven) days. (Patient not taking: Reported on 05/02/2022) 3 mL 0   triamcinolone ointment (KENALOG) 0.5 % Apply topically 2 (two) times daily.     venlafaxine XR (EFFEXOR-XR) 75 MG 24 hr capsule TAKE (1) CAPSULE BY MOUTH EACH MORNING. 90 capsule 3   No current facility-administered medications on file prior to visit.   Allergies  Allergen Reactions   Adhesive [Tape] Rash    steristrips   Social History   Socioeconomic History   Marital status: Married  Spouse name: Not on file   Number of children: Not on file   Years of education: Not on file   Highest education level: Not on file  Occupational History   Not on file  Tobacco Use   Smoking status: Never   Smokeless tobacco: Never  Vaping Use   Vaping Use: Never used  Substance and Sexual Activity   Alcohol use: No    Comment: occ   Drug use: No   Sexual activity: Yes    Partners: Male    Comment: 1st intercourse- 17, partners- 1  Other Topics Concern   Not on file  Social History Narrative   Not on file   Social Determinants of Health   Financial Resource Strain: Low Risk  (01/26/2019)   Overall Financial Resource Strain (CARDIA)    Difficulty of Paying Living Expenses: Not hard at all  Food Insecurity: No Food Insecurity  (01/26/2019)   Hunger Vital Sign    Worried About Running Out of Food in the Last Year: Never true    [place] in the Last Year: Never true  Transportation Needs: Unknown (01/26/2019)   PRAPARE - Hydrologist (Medical): No    Lack of Transportation (Non-Medical): Not on file  Physical Activity: Not on file  Stress: No Stress Concern Present (01/26/2019)   [place]    Feeling of Stress : Only a little  Social Connections: Not on file  Intimate Partner Violence: Not At Risk (01/26/2019)   Humiliation, Afraid, Rape, and Kick questionnaire    Fear of Current or Ex-Partner: No    Emotionally Abused: No    Physically Abused: No    Sexually Abused: No      Review of Systems  All other systems reviewed and are negative.      Objective:   Physical Exam Constitutional:      Appearance: Normal appearance. She is obese.  HENT:     Right Ear: Tympanic membrane and ear canal normal.     Left Ear: Tympanic membrane and ear canal normal.     Nose: Nose normal. No congestion or rhinorrhea.     Mouth/Throat:     Pharynx: Oropharynx is clear. No oropharyngeal exudate or posterior oropharyngeal erythema.  Cardiovascular:     Rate and Rhythm: Normal rate and regular rhythm.     Heart sounds: Normal heart sounds.  Pulmonary:     Effort: Pulmonary effort is normal.     Breath sounds: Normal breath sounds.  Abdominal:     Palpations: Abdomen is soft. There is no hepatomegaly, splenomegaly or mass.  Lymphadenopathy:     Cervical: No cervical adenopathy.     Lower Body: No right inguinal adenopathy. No left inguinal adenopathy.  Neurological:     Mental Status: She is alert.           Assessment & Plan:  Bronchitis Patient is lungs sound clear.  I believe she most likely has a viral bronchitis.  I recommended tincture of time.  I think is going to take another week for the cough to  gradually go away.  I recommended supportive care with Hycodan.  I did give the patient a prescription for an antibiotic with strict instructions not to fill unless she develops purulent sputum fevers chest pain or shortness of breath.  However I do not feel that the patient has a bacterial infection at this time.  She will  not get the antibiotic unless any of those symptoms develop

## 2022-10-17 ENCOUNTER — Other Ambulatory Visit: Payer: Self-pay | Admitting: Family Medicine

## 2022-10-17 NOTE — Telephone Encounter (Signed)
Requested Prescriptions  Pending Prescriptions Disp Refills   rizatriptan (MAXALT-MLT) 10 MG disintegrating tablet [Pharmacy Med Name: RIZATRIPTAN 10 MG ODT] 10 tablet 0    Sig: Dissolve 1 tablet (10 mg total) by mouth as needed for migraine. May repeat in 2 hours if needed. Max 2 doses in 24 hours, max 3 days per week.     Neurology:  Migraine Therapy - Triptan Failed - 10/17/2022  2:57 PM      Failed - Valid encounter within last 12 months    Recent Outpatient Visits           1 year ago Class 3 severe obesity with serious comorbidity and body mass index (BMI) of 40.0 to 44.9 in adult, unspecified obesity type ([place])   [place] Susy Frizzle, MD   1 year ago Migraine without status migrainosus, not intractable, unspecified migraine type   [place] Susy Frizzle, MD   1 year ago Class 3 severe obesity with serious comorbidity and body mass index (BMI) of 40.0 to 44.9 in adult, unspecified obesity type [facility])   [place] Susy Frizzle, MD   1 year ago Class 3 severe obesity with serious comorbidity and body mass index (BMI) of 40.0 to 44.9 in adult, unspecified obesity type [facility])   [place] Pickard, Cammie Mcgee, MD   2 years ago Reactive lymphadenopathy   [place], Cammie Mcgee, MD              Passed - Last BP in normal range    BP Readings from Last 1 Encounters:  05/22/22 122/82

## 2022-12-03 DIAGNOSIS — M25562 Pain in left knee: Secondary | ICD-10-CM | POA: Insufficient documentation

## 2023-02-25 ENCOUNTER — Ambulatory Visit: Payer: BC Managed Care – PPO | Admitting: Family Medicine

## 2023-02-25 ENCOUNTER — Encounter: Payer: Self-pay | Admitting: Family Medicine

## 2023-02-25 VITALS — BP 118/90 | HR 80 | Temp 97.6°F | Ht 64.0 in | Wt 272.0 lb

## 2023-02-25 DIAGNOSIS — B354 Tinea corporis: Secondary | ICD-10-CM

## 2023-02-25 MED ORDER — KETOCONAZOLE 2 % EX CREA: 1.0000 | TOPICAL_CREAM | Freq: Every day | CUTANEOUS | 0 refills | Status: DC

## 2023-02-25 NOTE — Assessment & Plan Note (Signed)
Findings consistent with tinea rash to lower abdomen and inner thighs. Will treat with Ketoconazole 2% cream daily for 2 weeks. Return to office if symptoms persist or worsen.

## 2023-02-25 NOTE — Progress Notes (Signed)
   Acute Office Visit  Subjective:     Patient ID: [name], female    DOB: 03/27/1986, 37 y.o.   MRN: [id number]  Chief Complaint  Patient presents with   Acute Visit    rash on arms and legs     HPI Patient is in today for an itchy rash to bilateral posterior upper arms, lower abdomen, and bilateral inner thighs. The rash is spreading, it is not painful, blistering, or draining. No recent viral illness or fever, no known exposures. No new medications, detergent, soaps, or other allergens. Has tried nothing.  Review of Systems  All other systems reviewed and are negative.       Objective:    BP (!) 118/90   Pulse 80   Temp 97.6 F (36.4 C) (Oral)   Ht 5\' 4"  (1.626 m)   Wt 272 lb (123.4 kg)   LMP 02/04/2023 (Approximate)   SpO2 97%   Breastfeeding No   BMI 46.69 kg/m    Physical Exam Vitals and nursing note reviewed.  Constitutional:      Appearance: Normal appearance. She is normal weight.  HENT:     Head: Normocephalic and atraumatic.  Skin:    General: Skin is warm and dry.     Findings: Lesion and rash present.          Comments: Erythematous annular lesions with raised borders  Neurological:     General: No focal deficit present.     Mental Status: She is alert and oriented to person, place, and time. Mental status is at baseline.  Psychiatric:        Mood and Affect: Mood normal.        Behavior: Behavior normal.        Thought Content: Thought content normal.        Judgment: Judgment normal.     No results found for any visits on 02/25/23.      Assessment & Plan:   Problem List Items Addressed This Visit     Tinea corporis - Primary    Findings consistent with tinea rash to lower abdomen and inner thighs. Will treat with Ketoconazole 2% cream daily for 2 weeks. Return to office if symptoms persist or worsen.      Relevant Medications   ketoconazole (NIZORAL) 2 % cream    Meds ordered this encounter  Medications    ketoconazole (NIZORAL) 2 % cream    Sig: Apply 1 Application topically daily.    Dispense:  60 g    Refill:  0    Order Specific Question:   Supervising Provider    Answer:   Lynnea Ferrier T [[id number]]    Return if symptoms worsen or fail to improve.  Park Meo, FNP

## 2023-04-14 ENCOUNTER — Other Ambulatory Visit: Payer: Self-pay

## 2023-04-14 ENCOUNTER — Encounter: Payer: Self-pay | Admitting: Family Medicine

## 2023-04-14 DIAGNOSIS — F410 Panic disorder [episodic paroxysmal anxiety] without agoraphobia: Secondary | ICD-10-CM

## 2023-04-14 MED ORDER — VENLAFAXINE HCL ER 75 MG PO CP24
ORAL_CAPSULE | ORAL | 1 refills | Status: DC
Start: 2023-04-14 — End: 2023-07-20

## 2023-05-01 ENCOUNTER — Ambulatory Visit: Payer: BC Managed Care – PPO | Admitting: Family Medicine

## 2023-05-01 ENCOUNTER — Encounter: Payer: Self-pay | Admitting: Family Medicine

## 2023-05-01 VITALS — BP 118/72 | HR 82 | Temp 98.3°F | Ht 64.0 in | Wt 284.6 lb

## 2023-05-01 DIAGNOSIS — Z13220 Encounter for screening for lipoid disorders: Secondary | ICD-10-CM | POA: Diagnosis not present

## 2023-05-01 LAB — CBC WITH DIFFERENTIAL/PLATELET
Absolute Monocytes: 585 cells/uL (ref 200–950)
Basophils Absolute: 60 cells/uL (ref 0–200)
Basophils Relative: 0.7 %
Eosinophils Absolute: 189 cells/uL (ref 15–500)
Eosinophils Relative: 2.2 %
HCT: 40.7 % (ref 35.0–45.0)
Hemoglobin: 13.5 g/dL (ref 11.7–15.5)
Lymphs Abs: 2408 cells/uL (ref 850–3900)
MCH: 28.8 pg (ref 27.0–33.0)
MCHC: 33.2 g/dL (ref 32.0–36.0)
MCV: 86.8 fL (ref 80.0–100.0)
MPV: 9.5 fL (ref 7.5–12.5)
Monocytes Relative: 6.8 %
Neutro Abs: 5358 cells/uL (ref 1500–7800)
Neutrophils Relative %: 62.3 %
Platelets: 301 10*3/uL (ref 140–400)
RBC: 4.69 10*6/uL (ref 3.80–5.10)
RDW: 13.1 % (ref 11.0–15.0)
Total Lymphocyte: 28 %
WBC: 8.6 10*3/uL (ref 3.8–10.8)

## 2023-05-01 NOTE — Progress Notes (Signed)
Subjective:    Patient ID: [name], female    DOB: [birth year], 37 y.o.   MRN: [id number]  HPI  Patient is a very pleasant 37 year old Caucasian female here today because of.  She is currently taking Maxalt for migraines.  She gets 1-2 migraines per month.  We discussed CGRP receptor blockers for prevention but at the present time she is doing well and wants to make no changes in her medication.  She is also on venlafaxine for depression.  The patient states that this medication is doing well and that she has been stable on this for quite some time.  Again she does not want make any changes.  She denies any chest pain or shortness of breath or dyspnea on exertion.  She is not yet due for mammogram or colonoscopy.  Her gynecologist is performing her Pap smears.  She is due for fasting labs to monitor her cholesterol.  Her BMI is elevated and we did discuss possibly restarting Wegovy.  The patient will check with her insurance regarding this  Past Medical History:  Diagnosis Date   Ankle fracture    hx of rt   Arm fracture, left    Carpal tunnel syndrome during pregnancy    Headache    Hx of pre-eclampsia in prior pregnancy, currently pregnant    Panic attacks history of   Pregnancy induced hypertension 2014   Past Surgical History:  Procedure Laterality Date   APPENDECTOMY     CESAREAN SECTION N/A 08/28/2013   Procedure: CESAREAN SECTION;  Surgeon: Genia Del, MD;  Location: [facility];  Service: Obstetrics;  Laterality: N/A;   CESAREAN SECTION N/A 02/01/2019   Procedure: CESAREAN SECTION;  Surgeon: Olivia Mackie, MD;  Location: [facility];  Service: Obstetrics;  Laterality: N/A;   CHOLECYSTECTOMY     tendonitis surgery  2006   TONSILLECTOMY     Current Outpatient Medications on File Prior to Visit  Medication Sig Dispense Refill   cetirizine (ZYRTEC) 10 MG tablet Take 10 mg by mouth daily.     famotidine (PEPCID) 20 MG tablet Take 20 mg by mouth See admin instructions.  Take 20 mg daily, may take an additional 20 mg dose as needed for heartburn     fluticasone (FLONASE) 50 MCG/ACT nasal spray Place 1 spray into both nostrils daily as needed for allergies or rhinitis.     rizatriptan (MAXALT-MLT) 10 MG disintegrating tablet Dissolve 1 tablet (10 mg total) by mouth as needed for migraine. May repeat in 2 hours if needed. Max 2 doses in 24 hours, max 3 days per week. 10 tablet 0   triamcinolone ointment (KENALOG) 0.5 % Apply topically 2 (two) times daily.     venlafaxine XR (EFFEXOR-XR) 75 MG 24 hr capsule TAKE (1) CAPSULE BY MOUTH EACH MORNING. 30 capsule 1   No current facility-administered medications on file prior to visit.   Allergies  Allergen Reactions   Adhesive [Tape] Rash    steristrips   Social History   Socioeconomic History   Marital status: Married    Spouse name: Not on file   Number of children: Not on file   Years of education: Not on file   Highest education level: Not on file  Occupational History   Not on file  Tobacco Use   Smoking status: Never   Smokeless tobacco: Never  Vaping Use   Vaping status: Never Used  Substance and Sexual Activity   Alcohol use: No    Comment: occ  Drug use: No   Sexual activity: Yes    Partners: Male    Comment: 1st intercourse- 46, partners- 1  Other Topics Concern   Not on file  Social History Narrative   Not on file   Social Determinants of Health   Financial Resource Strain: Low Risk  (01/26/2019)   Overall Financial Resource Strain (CARDIA)    Difficulty of Paying Living Expenses: Not hard at all  Food Insecurity: No Food Insecurity (01/26/2019)   Hunger Vital Sign    Worried About Running Out of Food in the Last Year: Never true    Ran Out of Food in the Last Year: Never true  Transportation Needs: Unknown (01/26/2019)   PRAPARE - Administrator, Civil Service (Medical): No    Lack of Transportation (Non-Medical): Not on file  Physical Activity: Not on file  Stress:  No Stress Concern Present (01/26/2019)   [employer] of Occupational Health - Occupational Stress Questionnaire    Feeling of Stress : Only a little  Social Connections: Unknown (02/17/2022)   Received [facility]   Social Network    Social Network: Not on file  Intimate Partner Violence: Unknown (01/09/2022)   Received from [facility]   HITS    Physically Hurt: Not on file    Insult or Talk Down To: Not on file    Threaten Physical Harm: Not on file    Scream or Curse: Not on file      Review of Systems  All other systems reviewed and are negative.      Objective:   Physical Exam Constitutional:      Appearance: Normal appearance.  Cardiovascular:     Rate and Rhythm: Normal rate and regular rhythm.     Heart sounds: Normal heart sounds.  Pulmonary:     Effort: Pulmonary effort is normal.     Breath sounds: Normal breath sounds.  Abdominal:     Palpations: Abdomen is soft. There is no hepatomegaly, splenomegaly or mass.  Lymphadenopathy:     Cervical: No cervical adenopathy.     Lower Body: No right inguinal adenopathy. No left inguinal adenopathy.  Neurological:     Mental Status: She is alert.           Assessment & Plan:  Screening cholesterol level - Plan: CBC with Differential/Platelet, COMPLETE METABOLIC PANEL WITH GFR, Lipid panel Physical exam today is normal.  Elevated antibiotic and then check CBC a CMP and a panel.  Cancer screening is up-to-date.  Blood pressure is normal.  I will make no changes in the Maxalt or her venlafaxine.  Overall she is doing well.  From a general health standpoint, I would like the patient to achieve weight loss.  I believe a GLP-1 agonist would be beneficial to her.  We discussed strategies to find this medication including a compounding pharmacy.  She will check with her insurance and let me know what she decides.

## 2023-05-07 ENCOUNTER — Encounter: Payer: Self-pay | Admitting: Family Medicine

## 2023-05-08 ENCOUNTER — Other Ambulatory Visit: Payer: Self-pay | Admitting: Family Medicine

## 2023-05-08 MED ORDER — WEGOVY 0.5 MG/0.5ML ~~LOC~~ SOAJ: 0.5000 mg | SUBCUTANEOUS | 1 refills | Status: AC

## 2023-07-17 ENCOUNTER — Encounter: Payer: Self-pay | Admitting: Family Medicine

## 2023-07-20 ENCOUNTER — Other Ambulatory Visit: Payer: Self-pay | Admitting: Family Medicine

## 2023-07-20 DIAGNOSIS — F410 Panic disorder [episodic paroxysmal anxiety] without agoraphobia: Secondary | ICD-10-CM

## 2023-07-20 MED ORDER — VENLAFAXINE HCL ER 75 MG PO CP24
ORAL_CAPSULE | ORAL | 2 refills | Status: DC
Start: 2023-07-20 — End: 2024-05-09

## 2023-09-04 ENCOUNTER — Other Ambulatory Visit: Payer: Self-pay | Admitting: Family Medicine

## 2023-09-04 DIAGNOSIS — F410 Panic disorder [episodic paroxysmal anxiety] without agoraphobia: Secondary | ICD-10-CM

## 2023-09-08 NOTE — Telephone Encounter (Signed)
Reordered 10/14/2 #90 2 RF  Requested Prescriptions  Refused Prescriptions Disp Refills   venlafaxine XR (EFFEXOR-XR) 75 MG 24 hr capsule [Pharmacy Med Name: venlafaxine ER 75 mg capsule,extended release 24 hr] 30 capsule 1    Sig: TAKE (1) CAPSULE BY MOUTH EACH MORNING.     Psychiatry: Antidepressants - SNRI - desvenlafaxine & venlafaxine Failed - 09/04/2023  8:00 AM      Failed - Valid encounter within last 6 months    Recent Outpatient Visits           2 years ago Class 3 severe obesity with serious comorbidity and body mass index (BMI) of 40.0 to 44.9 in adult, unspecified obesity type (HCC)   [facility] Medicine Donita Brooks, MD   2 years ago Migraine without status migrainosus, not intractable, unspecified migraine type   [facility] Medicine Donita Brooks, MD   2 years ago Class 3 severe obesity with serious comorbidity and body mass index (BMI) of 40.0 to 44.9 in adult, unspecified obesity type [facility])   [facility] Medicine Donita Brooks, MD   2 years ago Class 3 severe obesity with serious comorbidity and body mass index (BMI) of 40.0 to 44.9 in adult, unspecified obesity type [facility])   [facility] Medicine Pickard, Priscille Heidelberg, MD   3 years ago Reactive lymphadenopathy   [facility] Medicine Tanya Nones, Priscille Heidelberg, MD              Failed - Lipid Panel in normal range within the last 12 months    Cholesterol  Date Value Ref Range Status  05/01/2023 175 <200 mg/dL Final   LDL Cholesterol (Calc)  Date Value Ref Range Status  05/01/2023 85 mg/dL (calc) Final    Comment:    Reference range: <100 . Desirable range <100 mg/dL for primary prevention;   <70 mg/dL for patients with CHD or diabetic patients  with > or = 2 CHD risk factors. Marland Kitchen LDL-C is now calculated using the Martin-Hopkins  calculation, which is a validated novel method providing  better accuracy than the Friedewald equation in the  estimation of LDL-C.  Horald Pollen et al. Lenox Ahr. 6010;932(35): 2061-2068  (http://education.[facility].com/faq/FAQ164)    HDL  Date Value Ref Range Status  05/01/2023 67 > OR = 50 mg/dL Final   Triglycerides  Date Value Ref Range Status  05/01/2023 135 <150 mg/dL Final         Passed - Cr in normal range and within 360 days    Creat  Date Value Ref Range Status  05/01/2023 0.69 0.50 - 0.97 mg/dL Final   Creatinine, Urine  Date Value Ref Range Status  02/01/2019 66.84 mg/dL Final         Passed - Last BP in normal range    BP Readings from Last 1 Encounters:  05/01/23 118/72

## 2024-01-12 ENCOUNTER — Encounter: Payer: Self-pay | Admitting: Family Medicine

## 2024-01-12 ENCOUNTER — Ambulatory Visit: Admitting: Family Medicine

## 2024-01-12 VITALS — BP 122/78 | HR 90 | Temp 98.4°F | Ht 64.0 in | Wt 282.0 lb

## 2024-01-12 DIAGNOSIS — G43829 Menstrual migraine, not intractable, without status migrainosus: Secondary | ICD-10-CM | POA: Diagnosis not present

## 2024-01-12 DIAGNOSIS — M5412 Radiculopathy, cervical region: Secondary | ICD-10-CM

## 2024-01-12 MED ORDER — PREDNISONE 20 MG PO TABS: ORAL_TABLET | ORAL | 0 refills | Status: AC

## 2024-01-12 NOTE — Progress Notes (Signed)
 Subjective:    Patient ID: [name], female    DOB: 1985/11/29, 38 y.o.   MRN: [id number]  Neck Pain  Associated symptoms include headaches.  Headache  Associated symptoms include neck pain.    Patient developed neck pain that radiates into her right shoulder and down her right arm starting 10 days ago.  The pain is deep and intense.  She also reports numbness and tingling in her fingertips especially at night.  She denies any weakness in her right arm.  She denies any decrease sensation in her right arm today.  She has normal reflexes in the right arm muscle strength is 5/5 equal and symmetric in the upper extremities.  She has no tenderness to palpation over the cervical spinous processes or trapezius muscle.  She does have decreased range of motion in the neck.  She also has a history of migraines and she gets a migraine the first day of her menstrual cycle every month.  She has been taking Maxalt however this is no longer stopping the migraines.  Her gynecologist recently started her on Imitrex and she saw no benefit with this. Past Medical History:  Diagnosis Date   Ankle fracture    hx of rt   Arm fracture, left    Carpal tunnel syndrome during pregnancy    Headache    Hx of pre-eclampsia in prior pregnancy, currently pregnant    Panic attacks history of   Pregnancy induced hypertension 2014   Past Surgical History:  Procedure Laterality Date   APPENDECTOMY     CESAREAN SECTION N/A 08/28/2013   Procedure: CESAREAN SECTION;  Surgeon: Genia Del, MD;  Location: [facility];  Service: Obstetrics;  Laterality: N/A;   CESAREAN SECTION N/A 02/01/2019   Procedure: CESAREAN SECTION;  Surgeon: Olivia Mackie, MD;  Location: [facility];  Service: Obstetrics;  Laterality: N/A;   CHOLECYSTECTOMY     tendonitis surgery  2006   TONSILLECTOMY     Current Outpatient Medications on File Prior to Visit  Medication Sig Dispense Refill   cetirizine (ZYRTEC) 10 MG tablet Take 10 mg by  mouth daily.     famotidine (PEPCID) 20 MG tablet Take 20 mg by mouth See admin instructions. Take 20 mg daily, may take an additional 20 mg dose as needed for heartburn     fluticasone (FLONASE) 50 MCG/ACT nasal spray Place 1 spray into both nostrils daily as needed for allergies or rhinitis.     levothyroxine (SYNTHROID) 50 MCG tablet Take 50 mcg by mouth daily.     rizatriptan (MAXALT-MLT) 10 MG disintegrating tablet Dissolve 1 tablet (10 mg total) by mouth as needed for migraine. May repeat in 2 hours if needed. Max 2 doses in 24 hours, max 3 days per week. 10 tablet 0   triamcinolone ointment (KENALOG) 0.5 % Apply topically 2 (two) times daily.     venlafaxine XR (EFFEXOR-XR) 75 MG 24 hr capsule TAKE (1) CAPSULE BY MOUTH EACH MORNING. 90 capsule 2   Semaglutide-Weight Management (WEGOVY) 0.5 MG/0.5ML SOAJ Inject 0.5 mg into the skin once a week. (Patient not taking: Reported on 01/12/2024) 2 mL 1   No current facility-administered medications on file prior to visit.   Allergies  Allergen Reactions   Adhesive [Tape] Rash    steristrips   Social History   Socioeconomic History   Marital status: Married    Spouse name: Not on file   Number of children: Not on file   Years of education: Not  on file   Highest education level: Master's degree (e.g., MA, MS, MEng, MEd, MSW, MBA)  Occupational History   Not on file  Tobacco Use   Smoking status: Never   Smokeless tobacco: Never  Vaping Use   Vaping status: Never Used  Substance and Sexual Activity   Alcohol use: No    Comment: occ   Drug use: No   Sexual activity: Yes    Partners: Male    Comment: 1st intercourse- 22, partners- 1  Other Topics Concern   Not on file  Social History Narrative   Not on file   Social Drivers of Health   Financial Resource Strain: Low Risk  (01/11/2024)   Overall Financial Resource Strain (CARDIA)    Difficulty of Paying Living Expenses: Not hard at all  Food Insecurity: No Food Insecurity  (01/11/2024)   Hunger Vital Sign    Worried About Running Out of Food in the Last Year: Never true    Ran Out of Food in the Last Year: Never true  Transportation Needs: No Transportation Needs (01/11/2024)   PRAPARE - Administrator, Civil Service (Medical): No    Lack of Transportation (Non-Medical): No  Physical Activity: Insufficiently Active (01/11/2024)   Exercise Vital Sign    Days of Exercise per Week: 2 days    Minutes of Exercise per Session: 20 min  Stress: No Stress Concern Present (01/11/2024)   [employer] of Occupational Health - Occupational Stress Questionnaire    Feeling of Stress : Only a little  Social Connections: Socially Integrated (01/11/2024)   Social Connection and Isolation Panel [NHANES]    Frequency of Communication with Friends and Family: More than three times a week    Frequency of Social Gatherings with Friends and Family: Three times a week    Attends Religious Services: More than 4 times per year    Active Member of Clubs or Organizations: Yes    Attends Banker Meetings: More than 4 times per year    Marital Status: Married  Catering manager Violence: Unknown (01/09/2022)   Received from [employer], [facility]   HITS    Physically Hurt: Not on file    Insult or Talk Down To: Not on file    Threaten Physical Harm: Not on file    Scream or Curse: Not on file      Review of Systems  Musculoskeletal:  Positive for neck pain.  Neurological:  Positive for headaches.  All other systems reviewed and are negative.      Objective:   Physical Exam Constitutional:      Appearance: Normal appearance. She is obese.  HENT:     Right Ear: Tympanic membrane and ear canal normal.     Left Ear: Tympanic membrane and ear canal normal.     Nose: Nose normal. No congestion or rhinorrhea.     Mouth/Throat:     Pharynx: Oropharynx is clear. No oropharyngeal exudate or posterior oropharyngeal erythema.  Cardiovascular:     Rate  and Rhythm: Normal rate and regular rhythm.     Heart sounds: Normal heart sounds.  Pulmonary:     Effort: Pulmonary effort is normal.     Breath sounds: Normal breath sounds.  Abdominal:     Palpations: Abdomen is soft. There is no hepatomegaly, splenomegaly or mass.  Musculoskeletal:     Cervical back: No spasms, tenderness or crepitus. Normal range of motion.       Back:  Lymphadenopathy:     Cervical: No cervical adenopathy.     Lower Body: No right inguinal adenopathy. No left inguinal adenopathy.  Neurological:     Mental Status: She is alert.           Assessment & Plan:  Cervical radiculopathy - Plan: DG Cervical Spine Complete  Menstrual migraine without status migrainosus, not intractable Patient has cervical radiculopathy.  Begin a prednisone taper pack and obtain an x-ray of the cervical spine.  If not improving after 6 weeks we will need to pursue an MRI.  Patient is also having menstrual migraines and has now tried and failed Maxalt as well as Imitrex.  Try Nurtec at the beginning of her next migraine to abort the migraine headache.  Samples given to the patient try.  She may have 1 pill per day as needed for migraine

## 2024-02-12 ENCOUNTER — Encounter: Payer: Self-pay | Admitting: Family Medicine

## 2024-02-12 ENCOUNTER — Other Ambulatory Visit: Payer: Self-pay | Admitting: Family Medicine

## 2024-02-12 MED ORDER — NURTEC 75 MG PO TBDP: 75.0000 mg | ORAL_TABLET | Freq: Every day | ORAL | 1 refills | Status: DC | PRN

## 2024-02-22 ENCOUNTER — Encounter: Payer: Self-pay | Admitting: Family Medicine

## 2024-02-22 ENCOUNTER — Ambulatory Visit: Admitting: Family Medicine

## 2024-02-22 VITALS — BP 126/84 | HR 92 | Ht 64.0 in | Wt 283.0 lb

## 2024-02-22 DIAGNOSIS — J302 Other seasonal allergic rhinitis: Secondary | ICD-10-CM

## 2024-02-22 DIAGNOSIS — J029 Acute pharyngitis, unspecified: Secondary | ICD-10-CM

## 2024-02-22 MED ORDER — AMOXICILLIN 875 MG PO TABS: 875.0000 mg | ORAL_TABLET | Freq: Two times a day (BID) | ORAL | 0 refills | Status: AC

## 2024-02-22 NOTE — Progress Notes (Signed)
 Patient Office Visit  Assessment & Plan:   Sore throat -     STREP GROUP A AG, W/REFLEX TO CULT -     Amoxicillin ; Take 1 tablet (875 mg total) by mouth 2 (two) times daily for 10 days.  Dispense: 20 tablet; Refill: 0 -     Culture, Group A Strep  Seasonal allergies   Assessment and Plan    Sore throat with neck tenderness Negative rapid strep test. Possible partially treated streptococcal pharyngitis due to prior Augmentin. Differential includes viral pharyngitis or partially treated bacterial infection. - Prescribe amoxicillin . - Advise gargling with warm salt water. - Recommend throat sprays for symptomatic relief. - Encourage hydration.  Allergic rhinitis Chronic allergic rhinitis exacerbated by severe allergy season. - Continue daily Zyrtec.           Return if symptoms worsen or fail to improve.   Subjective:     Patient ID: [name], female    DOB: [birth year]  Age: 38 y.o. MRN: [id number]  Chief Complaint  Patient presents with   Sore Throat    Seen in UC 2 weekends ago and was given Augmentin, finished on 02/21/24. Pt states she found white spots on back of throat yesterday.     Sore Throat    Discussed the use of AI scribe software for clinical note transcription with the patient, who gave verbal consent to proceed.  History of Present Illness   [name] is a 38 year old female who presents with a sore throat and neck tenderness.  She has experienced a sore throat since the previous Saturday, which has progressively worsened. Initially, she had ear pain and visited urgent care, where it was suggested that the issue might be related to her salivary gland rather than her ears. The pain initially moved from one ear to the other before settling in her throat.  She was prescribed Augmentin for seven days, which she completed without relief. Over the weekend, she took four leftover pills of Augmentin, but her condition did not improve. She  describes the pain as 'pretty bad' and notes difficulty swallowing and tenderness in her neck. No fever or chills, but she mentions feeling pain. She has not experienced nausea or vomiting and has been able to stay hydrated despite a lack of appetite.  She takes Zyrtec daily for allergies, which have been particularly bad recently. 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 She denies smoking and has no known allergies to antibiotics.        The ASCVD Risk score (Arnett DK, et al., 2019) failed to calculate for the following reasons:   The 2019 ASCVD risk score is only valid for ages 26 to 41  Past Medical History:  Diagnosis Date   Ankle fracture    hx of rt   Arm fracture, left    Carpal tunnel syndrome during pregnancy    Headache    Hx of pre-eclampsia in prior pregnancy, currently pregnant    Panic attacks history of   Pregnancy induced hypertension 2014   Past Surgical History:  Procedure Laterality Date   APPENDECTOMY     CESAREAN SECTION N/A 08/28/2013   Procedure: CESAREAN SECTION;  Surgeon: Marie-Lyne Lavoie, MD;  Location: [facility];  Service: Obstetrics;  Laterality: N/A;   CESAREAN SECTION N/A 02/01/2019   Procedure: CESAREAN SECTION;  Surgeon:  Meriam Stamp, MD;  Location: [facility];  Service: Obstetrics;  Laterality: N/A;   CHOLECYSTECTOMY     tendonitis surgery  2006   TONSILLECTOMY     Social History   Tobacco Use   Smoking status: Never   Smokeless tobacco: Never  Vaping Use   Vaping status: Never Used  Substance Use Topics   Alcohol use: No    Comment: occ   Drug use: No   Family History  Problem Relation Age of Onset   Stroke Paternal Grandmother    Cancer Maternal Uncle        leukemia   Cancer Maternal Grandfather        leukemia   Heart attack Maternal Grandfather    Allergies  Allergen Reactions   Adhesive  [Tape] Rash    steristrips    ROS    Objective:    BP 126/84   Pulse 92   Ht 5\' 4"  (1.626 m)   Wt 283 lb (128.4 kg)   SpO2 98%   BMI 48.58 kg/m  BP Readings from Last 3 Encounters:  02/22/24 126/84  01/12/24 122/78  05/01/23 118/72   Wt Readings from Last 3 Encounters:  02/22/24 283 lb (128.4 kg)  01/12/24 282 lb (127.9 kg)  05/01/23 284 lb 9.6 oz (129.1 kg)    Physical Exam Vitals and nursing note reviewed.  Constitutional:      General: She is not in acute distress.    Appearance: Normal appearance.  HENT:     Head: Normocephalic.     Right Ear: Ear canal and external ear normal.     Left Ear: Tympanic membrane, ear canal and external ear normal.     Mouth/Throat:     Pharynx: Oropharyngeal exudate and posterior oropharyngeal erythema present.     Comments: White spots right side Eyes:     Extraocular Movements: Extraocular movements intact.     Pupils: Pupils are equal, round, and reactive to light.  Cardiovascular:     Rate and Rhythm: Normal rate.     Heart sounds: Normal heart sounds.  Pulmonary:     Effort: Pulmonary effort is normal.     Breath sounds: Normal breath sounds. No wheezing.  Neurological:     General: No focal deficit present.     Mental Status: She is alert and oriented to person, place, and time.  Psychiatric:        Mood and Affect: Mood normal.        Behavior: Behavior normal.        Thought Content: Thought content normal.      Results for orders placed or performed in visit on 02/22/24  STREP GROUP A AG, W/REFLEX TO CULT   Specimen: Throat  Result Value Ref Range   Streptococcus Group A AG NOT DETECTED NOT DETECTED

## 2024-02-24 ENCOUNTER — Ambulatory Visit: Payer: Self-pay | Admitting: Family Medicine

## 2024-02-24 LAB — CULTURE, GROUP A STREP
Micro Number: 16472269
SPECIMEN QUALITY:: ADEQUATE

## 2024-02-24 LAB — STREP GROUP A AG, W/REFLEX TO CULT: Streptococcus Group A AG: NOT DETECTED

## 2024-02-26 ENCOUNTER — Other Ambulatory Visit: Payer: Self-pay

## 2024-02-26 ENCOUNTER — Encounter: Payer: Self-pay | Admitting: Family Medicine

## 2024-02-26 ENCOUNTER — Other Ambulatory Visit: Payer: Self-pay | Admitting: Family Medicine

## 2024-02-26 MED ORDER — FLUCONAZOLE 150 MG PO TABS: ORAL_TABLET | ORAL | 0 refills | Status: DC

## 2024-02-26 MED ORDER — FLUCONAZOLE 150 MG PO TABS: ORAL_TABLET | ORAL | 0 refills | Status: AC

## 2024-05-07 ENCOUNTER — Other Ambulatory Visit: Payer: Self-pay | Admitting: Family Medicine

## 2024-05-07 DIAGNOSIS — F410 Panic disorder [episodic paroxysmal anxiety] without agoraphobia: Secondary | ICD-10-CM

## 2024-05-09 ENCOUNTER — Telehealth: Payer: Self-pay | Admitting: Family Medicine

## 2024-05-09 ENCOUNTER — Other Ambulatory Visit: Payer: Self-pay

## 2024-05-09 DIAGNOSIS — G43829 Menstrual migraine, not intractable, without status migrainosus: Secondary | ICD-10-CM

## 2024-05-09 MED ORDER — NURTEC 75 MG PO TBDP: 75.0000 mg | ORAL_TABLET | Freq: Every day | ORAL | 1 refills | Status: DC | PRN

## 2024-05-09 NOTE — Telephone Encounter (Signed)
 Prescription Request  05/09/2024  LOV: 01/12/2024  What is the name of the medication or equipment?   Rimegepant Sulfate (NURTEC) 75 MG TBDP   Have you contacted your pharmacy to request a refill? Yes   Which pharmacy would you like this sent to?  [facility] [place], [place] - [id number] [place] [address] Phone: [phone] Fax: [phone]    Patient notified that their request is being sent to the clinical staff for review and that they should receive a response within 2 business days.   Please advise pharmacist.

## 2024-05-19 ENCOUNTER — Other Ambulatory Visit: Payer: Self-pay | Admitting: Family Medicine

## 2024-05-19 DIAGNOSIS — G43829 Menstrual migraine, not intractable, without status migrainosus: Secondary | ICD-10-CM

## 2024-06-09 ENCOUNTER — Other Ambulatory Visit (HOSPITAL_COMMUNITY): Payer: Self-pay

## 2024-08-19 ENCOUNTER — Ambulatory Visit: Admitting: Family Medicine

## 2024-08-19 ENCOUNTER — Encounter: Payer: Self-pay | Admitting: Family Medicine

## 2024-08-19 VITALS — BP 128/89 | HR 94 | Temp 98.1°F | Ht 64.0 in | Wt 284.6 lb

## 2024-08-19 DIAGNOSIS — L304 Erythema intertrigo: Secondary | ICD-10-CM

## 2024-08-19 DIAGNOSIS — R591 Generalized enlarged lymph nodes: Secondary | ICD-10-CM | POA: Diagnosis not present

## 2024-08-19 MED ORDER — CLOTRIMAZOLE-BETAMETHASONE 1-0.05 % EX CREA: 1.0000 | TOPICAL_CREAM | Freq: Every day | CUTANEOUS | 0 refills | Status: AC

## 2024-08-19 NOTE — Progress Notes (Signed)
 Subjective:    Patient ID: [name], female    DOB: [birth year], 38 y.o.   MRN: [id number]  Patient states for 2 months, she has had swollen lymph nodes on the right side of her neck.  Underneath her right mandible, there is a 1 cm x 2 cm tender enlarged lymph node.  She also has an adjacent lymph node that is hard to palpate but she states she can feel on the inside it feels like it is swollen and tender.  She is concerned because she has a family history of lymphoma.  She denies any fevers or chills.  She denies any night sweats.  She also has a rash underneath her right breast.  She has been applying triamcinolone and Lidex cream to this for several weeks without benefit.  This of erythematous rash in the intertriginous area underneath her right breast.  However it does have a fine white scale.  It is papular.  However it is limited to the intertriginous area underneath her breast.  She states that it itches. Past Medical History:  Diagnosis Date   Ankle fracture    hx of rt   Arm fracture, left    Carpal tunnel syndrome during pregnancy    Headache    Hx of pre-eclampsia in prior pregnancy, currently pregnant    Panic attacks history of   Pregnancy induced hypertension 2014   Past Surgical History:  Procedure Laterality Date   APPENDECTOMY     CESAREAN SECTION N/A 08/28/2013   Procedure: CESAREAN SECTION;  Surgeon: Marie-Lyne Lavoie, MD;  Location: [facility];  Service: Obstetrics;  Laterality: N/A;   CESAREAN SECTION N/A 02/01/2019   Procedure: CESAREAN SECTION;  Surgeon: Gorge Ade, MD;  Location: [facility];  Service: Obstetrics;  Laterality: N/A;   CHOLECYSTECTOMY     tendonitis surgery  2006   TONSILLECTOMY     Current Outpatient Medications on File Prior to Visit  Medication Sig Dispense Refill   cetirizine (ZYRTEC) 10 MG tablet Take 10 mg by mouth daily.     famotidine  (PEPCID ) 20 MG tablet Take 20 mg by mouth See admin instructions. Take 20 mg daily, may take an  additional 20 mg dose as needed for heartburn     fluocinonide ointment (LIDEX) 0.05 % Apply 1 Application topically 2 (two) times daily.     fluticasone (FLONASE) 50 MCG/ACT nasal spray Place 1 spray into both nostrils daily as needed for allergies or rhinitis.     levothyroxine (SYNTHROID) 50 MCG tablet Take 50 mcg by mouth daily.     predniSONE  (DELTASONE ) 20 MG tablet 3 tabs poqday 1-2, 2 tabs poqday 3-4, 1 tab poqday 5-6 12 tablet 0   Rimegepant Sulfate (NURTEC) 75 MG TBDP Take 1 tablet (75 mg total) by mouth daily as needed. 15 tablet 1   triamcinolone ointment (KENALOG) 0.5 % Apply topically 2 (two) times daily.     venlafaxine  XR (EFFEXOR -XR) 75 MG 24 hr capsule TAKE (1) CAPSULE BY MOUTH EACH MORNING. 90 capsule 2   fluconazole  (DIFLUCAN ) 150 MG tablet Take 1 tablet, 150mg  by mouth and repeat in 3-4 days if symptoms persist. (Patient not taking: Reported on 08/19/2024) 2 tablet 0   rizatriptan  (MAXALT -MLT) 10 MG disintegrating tablet Dissolve 1 tablet (10 mg total) by mouth as needed for migraine. May repeat in 2 hours if needed. Max 2 doses in 24 hours, max 3 days per week. (Patient not taking: Reported on 08/19/2024) 10 tablet 0   Semaglutide -Weight Management (  WEGOVY ) 0.5 MG/0.5ML SOAJ Inject 0.5 mg into the skin once a week. (Patient not taking: Reported on 08/19/2024) 2 mL 1   No current facility-administered medications on file prior to visit.   Allergies  Allergen Reactions   Adhesive [Tape] Rash    steristrips   Social History   Socioeconomic History   Marital status: Married    Spouse name: Not on file   Number of children: Not on file   Years of education: Not on file   Highest education level: Master's degree (e.g., MA, MS, MEng, MEd, MSW, MBA)  Occupational History   Not on file  Tobacco Use   Smoking status: Never   Smokeless tobacco: Never  Vaping Use   Vaping status: Never Used  Substance and Sexual Activity   Alcohol use: No    Comment: occ   Drug use: No    Sexual activity: Yes    Partners: Male    Comment: 1st intercourse- 68, partners- 1  Other Topics Concern   Not on file  Social History Narrative   Not on file   Social Drivers of Health   Financial Resource Strain: Low Risk  (08/18/2024)   Overall Financial Resource Strain (CARDIA)    Difficulty of Paying Living Expenses: Not hard at all  Food Insecurity: No Food Insecurity (08/18/2024)   Hunger Vital Sign    Worried About Running Out of Food in the Last Year: Never true    Ran Out of Food in the Last Year: Never true  Transportation Needs: No Transportation Needs (08/18/2024)   PRAPARE - Administrator, Civil Service (Medical): No    Lack of Transportation (Non-Medical): No  Physical Activity: Insufficiently Active (01/11/2024)   Exercise Vital Sign    Days of Exercise per Week: 2 days    Minutes of Exercise per Session: 20 min  Stress: Stress Concern Present (08/18/2024)   [employer] of Occupational Health - Occupational Stress Questionnaire    Feeling of Stress: To some extent  Social Connections: Socially Integrated (08/18/2024)   Social Connection and Isolation Panel    Frequency of Communication with Friends and Family: More than three times a week    Frequency of Social Gatherings with Friends and Family: Twice a week    Attends Religious Services: More than 4 times per year    Active Member of [employer] or Organizations: Yes    Attends Banker Meetings: More than 4 times per year    Marital Status: Married  Catering Manager Violence: Unknown (01/09/2022)   Received from [facility]   HITS    Physically Hurt: Not on file    Insult or Talk Down To: Not on file    Threaten Physical Harm: Not on file    Scream or Curse: Not on file      Review of Systems  All other systems reviewed and are negative.      Objective:   Physical Exam Constitutional:      Appearance: Normal appearance. She is obese.  HENT:     Right Ear: Tympanic  membrane and ear canal normal.     Left Ear: Tympanic membrane and ear canal normal.     Nose: Nose normal. No congestion or rhinorrhea.     Mouth/Throat:     Pharynx: Oropharynx is clear. No oropharyngeal exudate or posterior oropharyngeal erythema.  Neck:   Cardiovascular:     Rate and Rhythm: Normal rate and regular rhythm.  Heart sounds: Normal heart sounds.  Pulmonary:     Effort: Pulmonary effort is normal.     Breath sounds: Normal breath sounds.  Chest:    Abdominal:     Palpations: Abdomen is soft. There is no hepatomegaly, splenomegaly or mass.  Lymphadenopathy:     Cervical: Cervical adenopathy present.     Right cervical: Superficial cervical adenopathy present.     Lower Body: No right inguinal adenopathy. No left inguinal adenopathy.  Skin:    Findings: Erythema and rash present. Rash is papular.      Neurological:     Mental Status: She is alert.           Assessment & Plan:  Lymphadenopathy - Plan: US  Soft Tissue Head/Neck (NON-THYROID)  Intertrigo I believe these are most likely reactive lymph nodes.  1 is slightly enlarged.  However patient is concerned because of the duration greater than 2 months and the tenderness.  Therefore I will obtain an ultrasound of the neck to evaluate the lymph nodes further to determine if they would benefit from a biopsy.  Recommended fasting lab work at her earliest convenience including CBC a CMP and a lipid panel.  I believe the rash underneath her right breast is likely intertrigo.  Although it does not have the profound erythema, I question if the repeated use of steroids may have calm down some the inflammation lessening the erythema and leaving an itchy papular rash.  It is certainly failed to respond to months of steroids.  Therefore we will try Lotrisone cream twice daily for 14 days and see if the patient notices improvement

## 2024-08-25 ENCOUNTER — Ambulatory Visit
Admission: RE | Admit: 2024-08-25 | Discharge: 2024-08-25 | Disposition: A | Discharge status: Home / Self Care | Source: Ambulatory Visit | Attending: Family Medicine | Admitting: Family Medicine

## 2024-08-25 DIAGNOSIS — R591 Generalized enlarged lymph nodes: Secondary | ICD-10-CM

## 2024-08-29 ENCOUNTER — Ambulatory Visit: Payer: Self-pay | Admitting: Family Medicine

## 2024-08-29 ENCOUNTER — Other Ambulatory Visit

## 2024-08-29 DIAGNOSIS — F411 Generalized anxiety disorder: Secondary | ICD-10-CM

## 2024-08-29 DIAGNOSIS — Z13220 Encounter for screening for lipoid disorders: Secondary | ICD-10-CM

## 2024-08-29 LAB — CBC WITH DIFFERENTIAL/PLATELET
Absolute Lymphocytes: 2803 {cells}/uL (ref 850–3900)
Absolute Monocytes: 586 {cells}/uL (ref 200–950)
Basophils Absolute: 58 {cells}/uL (ref 0–200)
Basophils Relative: 0.6 %
Eosinophils Absolute: 317 {cells}/uL (ref 15–500)
Eosinophils Relative: 3.3 %
HCT: 39.9 % (ref 35.9–46.0)
Hemoglobin: 13.2 g/dL (ref 11.7–15.5)
MCH: 28.4 pg (ref 27.0–33.0)
MCHC: 33.1 g/dL (ref 31.6–35.4)
MCV: 86 fL (ref 81.4–101.7)
MPV: 9.7 fL (ref 7.5–12.5)
Monocytes Relative: 6.1 %
Neutro Abs: 5837 {cells}/uL (ref 1500–7800)
Neutrophils Relative %: 60.8 %
Platelets: 355 Thousand/uL (ref 140–400)
RBC: 4.64 Million/uL (ref 3.80–5.10)
RDW: 13.6 % (ref 11.0–15.0)
Total Lymphocyte: 29.2 %
WBC: 9.6 Thousand/uL (ref 3.8–10.8)

## 2024-08-29 LAB — LIPID PANEL
Cholesterol: 140 mg/dL (ref <200)
HDL: 55 mg/dL (ref >=50)
LDL Cholesterol (Calc): 68 mg/dL
Non-HDL Cholesterol (Calc): 85 mg/dL (ref <130)
Total CHOL/HDL Ratio: 2.5 (calc) (ref <5.0)
Triglycerides: 87 mg/dL (ref <150)

## 2024-08-29 LAB — COMPREHENSIVE METABOLIC PANEL WITH GFR
AG Ratio: 1.3 (calc) (ref 1.0–2.5)
ALT: 15 U/L (ref 6–29)
AST: 13 U/L (ref 10–30)
Albumin: 4.1 g/dL (ref 3.6–5.1)
Alkaline phosphatase (APISO): 72 U/L (ref 31–125)
BUN: 10 mg/dL (ref 7–25)
CO2: 27 mmol/L (ref 20–32)
Calcium: 9.2 mg/dL (ref 8.6–10.2)
Chloride: 102 mmol/L (ref 98–110)
Creat: 0.86 mg/dL (ref 0.50–0.97)
Globulin: 3.2 g/dL (ref 1.9–3.7)
Glucose, Bld: 108 mg/dL — ABNORMAL HIGH (ref 65–99)
Potassium: 4.9 mmol/L (ref 3.5–5.3)
Sodium: 138 mmol/L (ref 135–146)
Total Bilirubin: 0.2 mg/dL (ref 0.2–1.2)
Total Protein: 7.3 g/dL (ref 6.1–8.1)
eGFR: 88 mL/min/1.73m2 (ref >=60)

## 2024-08-30 ENCOUNTER — Ambulatory Visit: Payer: Self-pay | Admitting: Family Medicine

## 2024-11-09 ENCOUNTER — Other Ambulatory Visit: Payer: Self-pay

## 2024-11-09 ENCOUNTER — Encounter: Payer: Self-pay | Admitting: Family Medicine

## 2024-11-09 DIAGNOSIS — G43829 Menstrual migraine, not intractable, without status migrainosus: Secondary | ICD-10-CM

## 2024-11-09 MED ORDER — NURTEC 75 MG PO TBDP: 75.0000 mg | ORAL_TABLET | Freq: Every day | ORAL | 1 refills | Status: AC | PRN
